# Patient Record
Sex: Male | Born: 1980
Health system: Southern US, Community
[De-identification: ages and names within clinical notes are randomized; demographics above are authoritative.]

---

## 2004-08-16 HISTORY — PX: APPENDECTOMY: SHX54

## 2004-11-24 ENCOUNTER — Inpatient Hospital Stay: Payer: Self-pay | Admitting: Surgery

## 2011-12-22 ENCOUNTER — Ambulatory Visit: Payer: Self-pay | Admitting: Family Medicine

## 2012-06-11 ENCOUNTER — Emergency Department: Payer: Self-pay | Admitting: *Deleted

## 2012-09-08 IMAGING — US US EXTREM LOW VENOUS*R*
1 series · 17 of 24 positions shown · non-contrast
Comparison: none

REASON FOR EXAM: STAT CR [id number] Right leg calf ankle and foot
swelling Eval for DVT
COMMENTS:

[Series 1: us extrem low venous*right* · 17 of 25 slices shown]
[im 1/25]
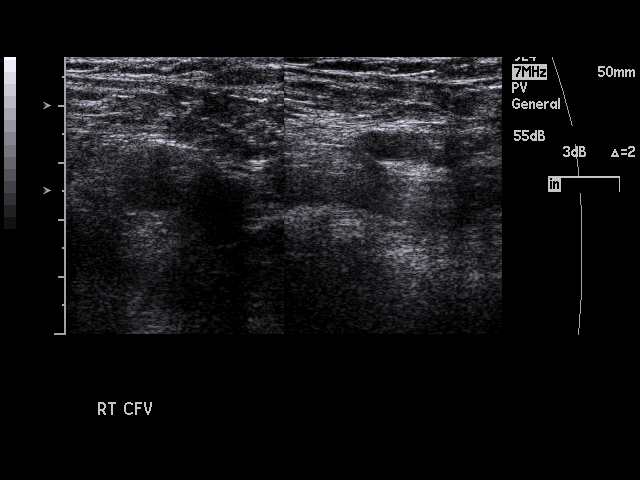
[im 3/25]
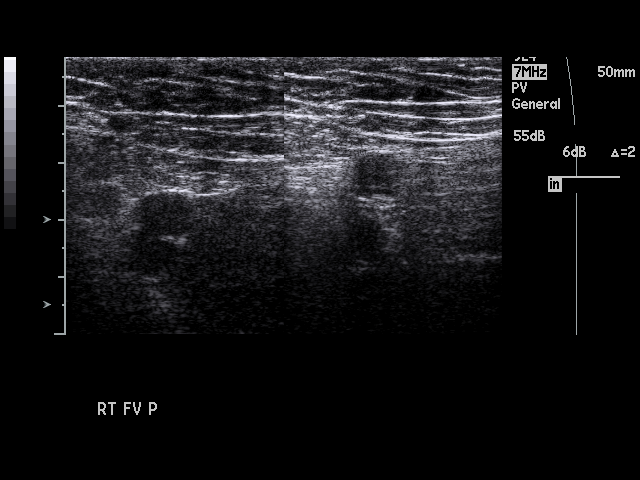
[im 4/25]
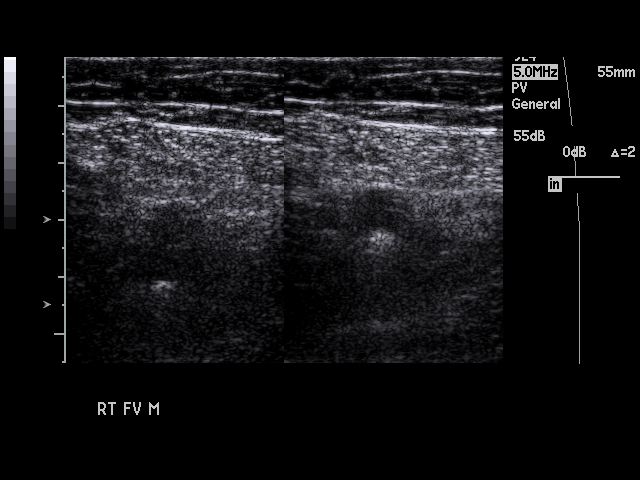
[im 5/25]
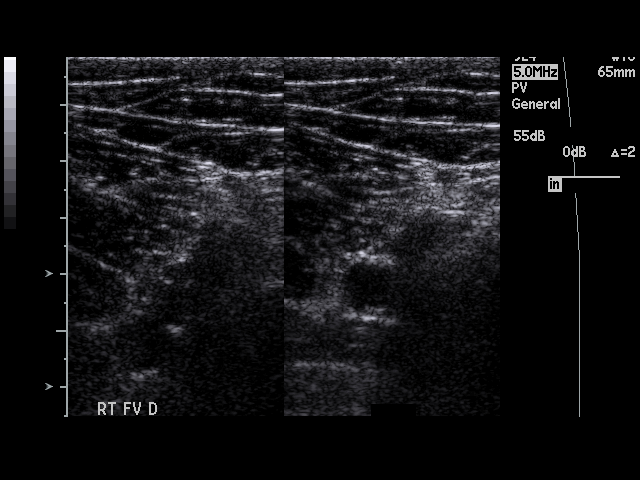
[im 7/25]
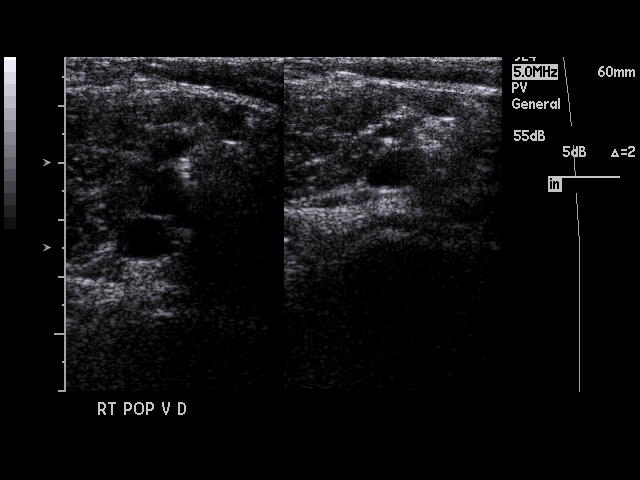
[im 8/25]
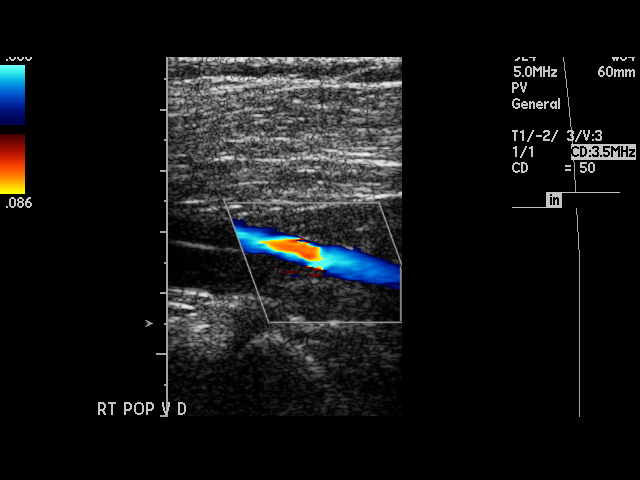
[im 10/25]
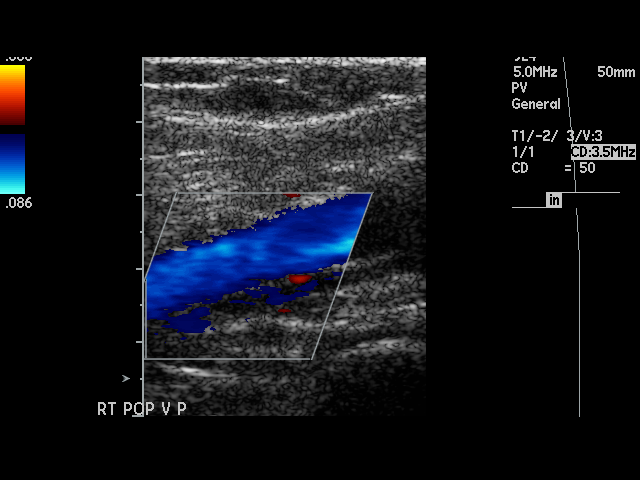
[im 11/25]
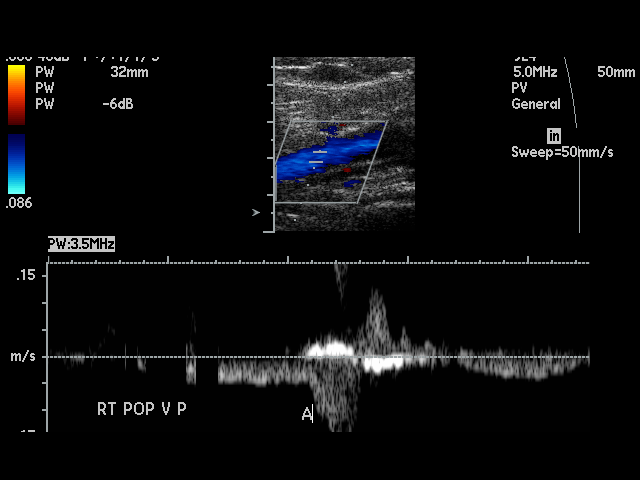
[im 13/25]
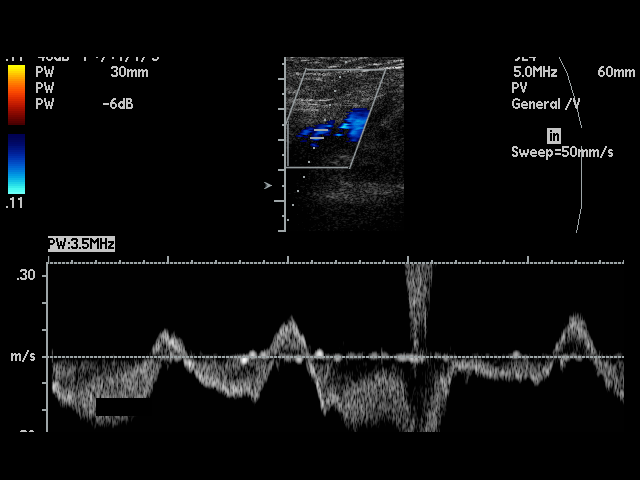
[im 14/25]
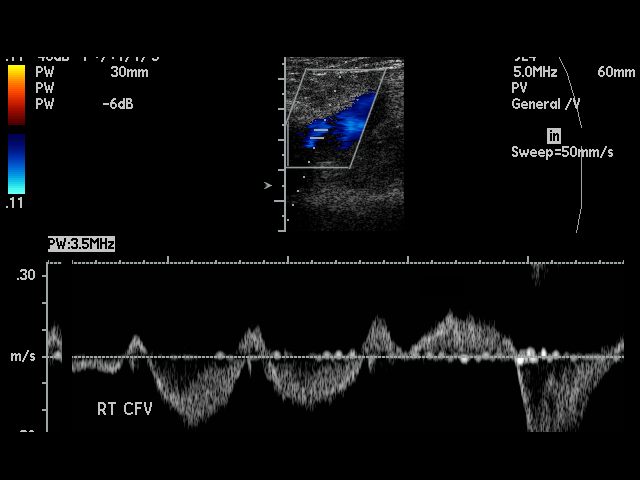
[im 15/25]
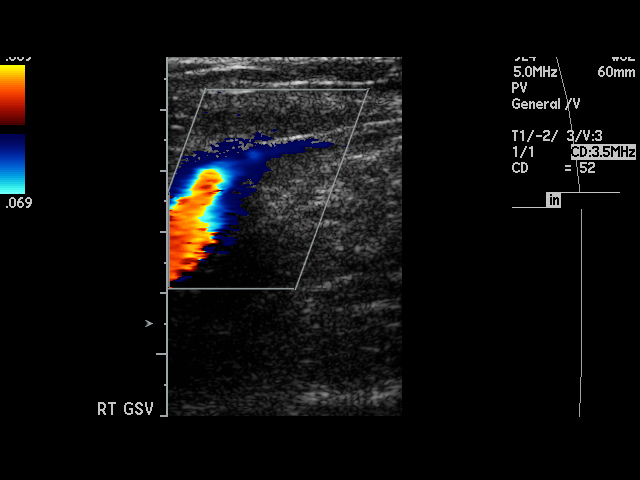
[im 17/25]
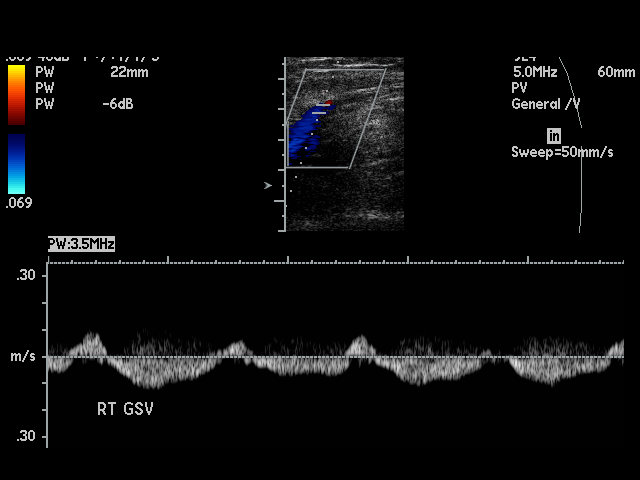
[im 18/25]
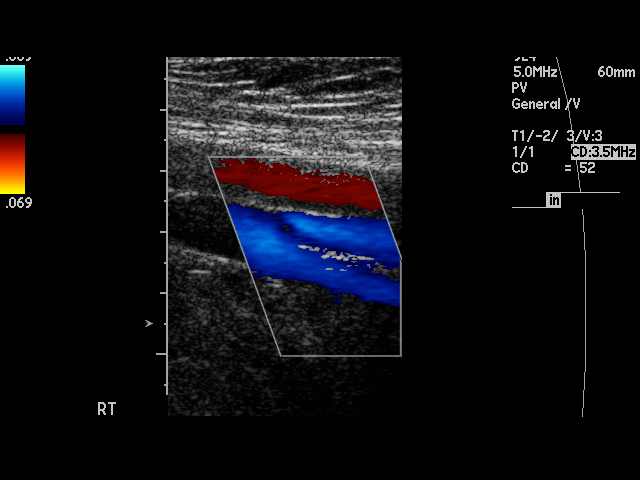
[im 20/25]
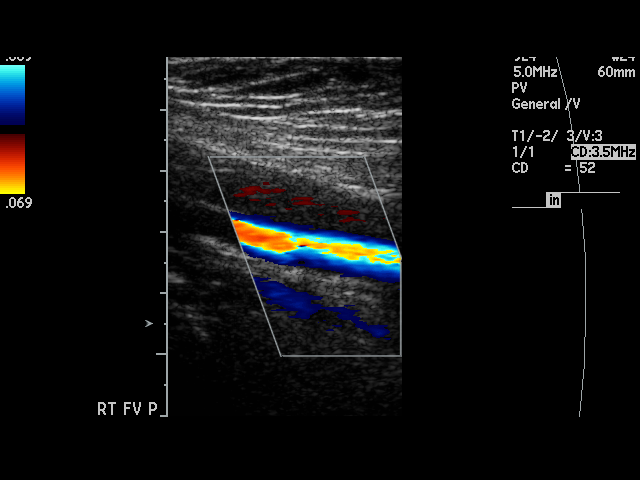
[im 21/25]
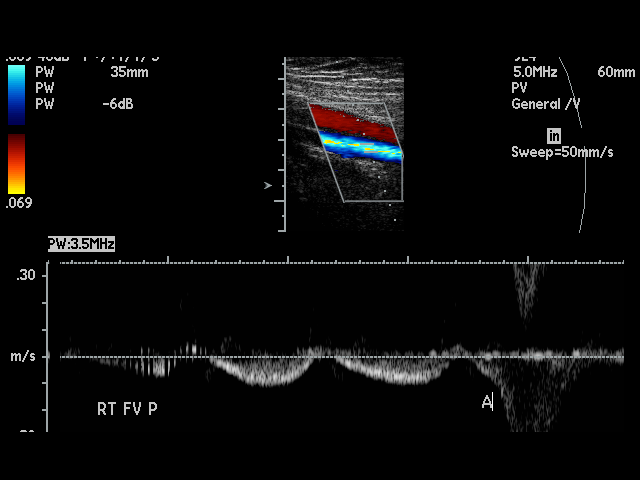
[im 22/25]
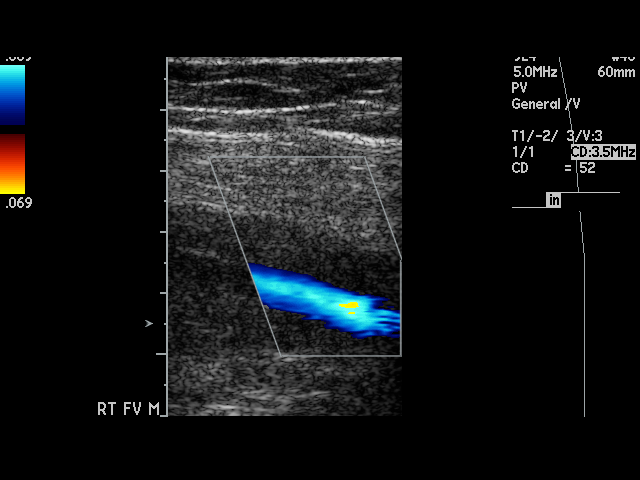
[im 25/25]
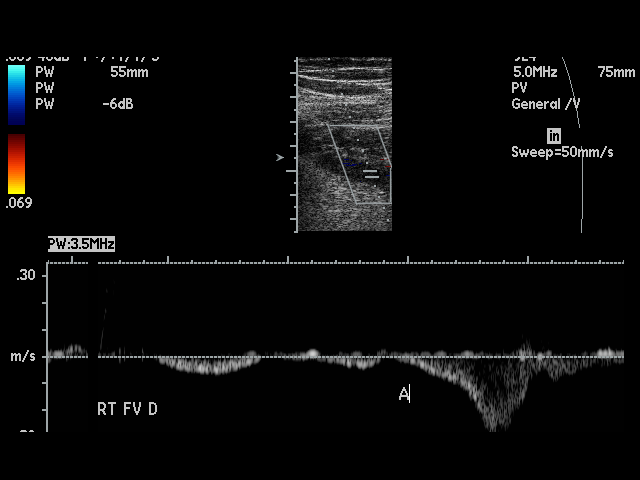

[17 of 24 positions shown; findings below may reference images not displayed]

PROCEDURE:     NAMA NAMAF - NAMA NAMAF DOPPLER VEINS RT LEG EXTR  - December 22, 2011  [DATE]

RESULT:     Doppler interrogation of the deep venous system of the right leg
is performed from the common femoral vein through the popliteal vein. The
deep venous structures demonstrate normal compressibility. The color and
spectral Doppler appearance is normal.
IMPRESSION: 1. No evidence of right lower extremity deep vein thrombosis.

[REDACTED]

## 2013-02-27 IMAGING — CR RIGHT ANKLE - COMPLETE 3+ VIEW
1 series · 5 of 5 positions shown · non-contrast
Comparison: none

REASON FOR EXAM: trauma
COMMENTS:

PROCEDURE:     DXR - DXR ANKLE RIGHT COMPLETE  - June 11, 2012  [DATE]
RESULT:     There is no evidence of fracture, dislocation, or malalignment.
There is evidence of a joint effusion particularly in the medial aspect of
the joint. This may represent component of ligamentous injury.

[Series 1: ap · 0.17mm/px · 5 of 5 slices shown]
[im 1/5]
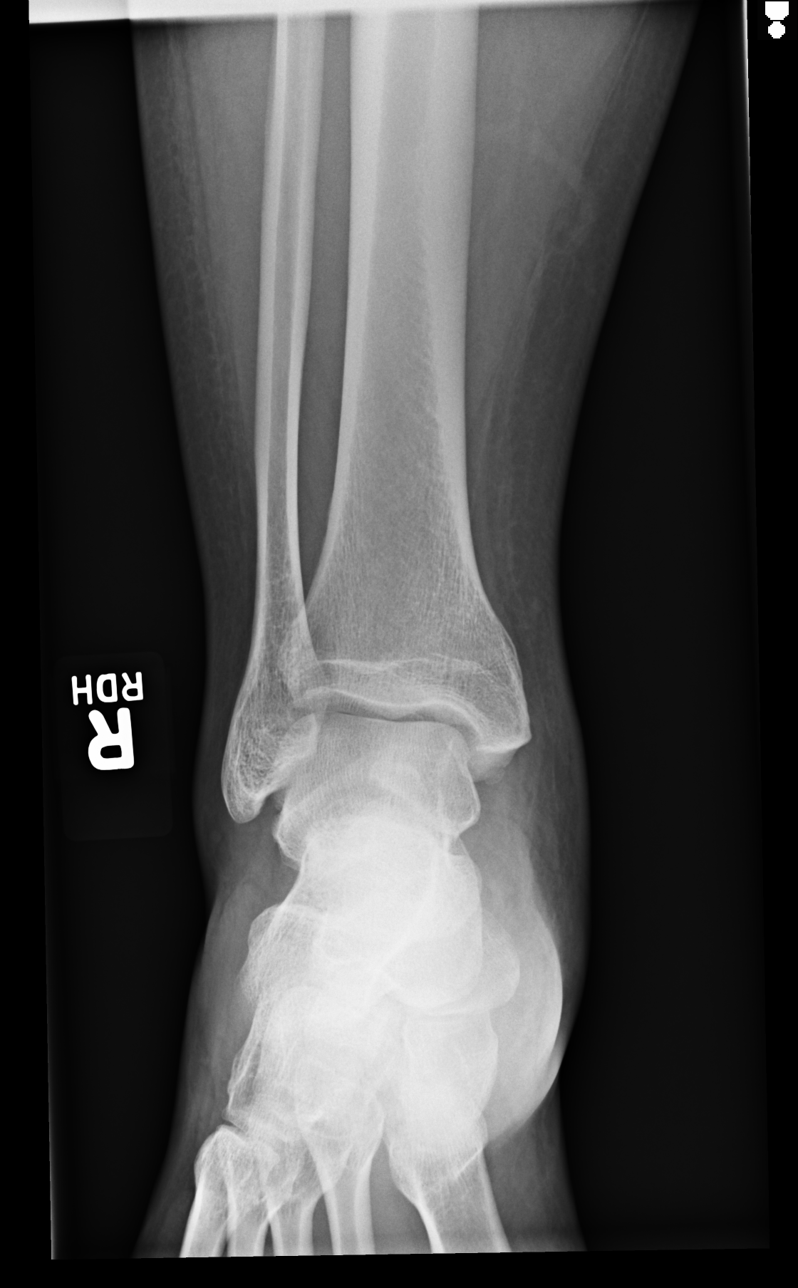
[im 2/5]
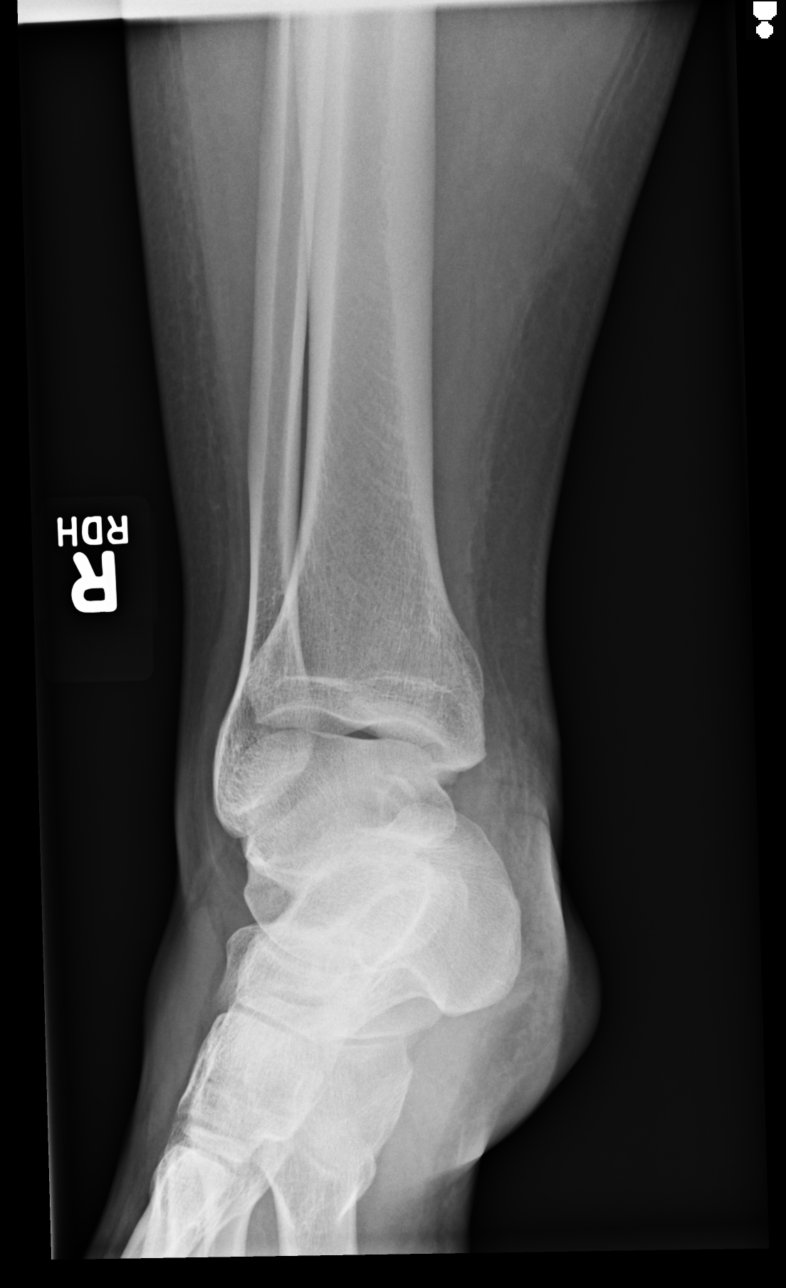
[im 3/5]
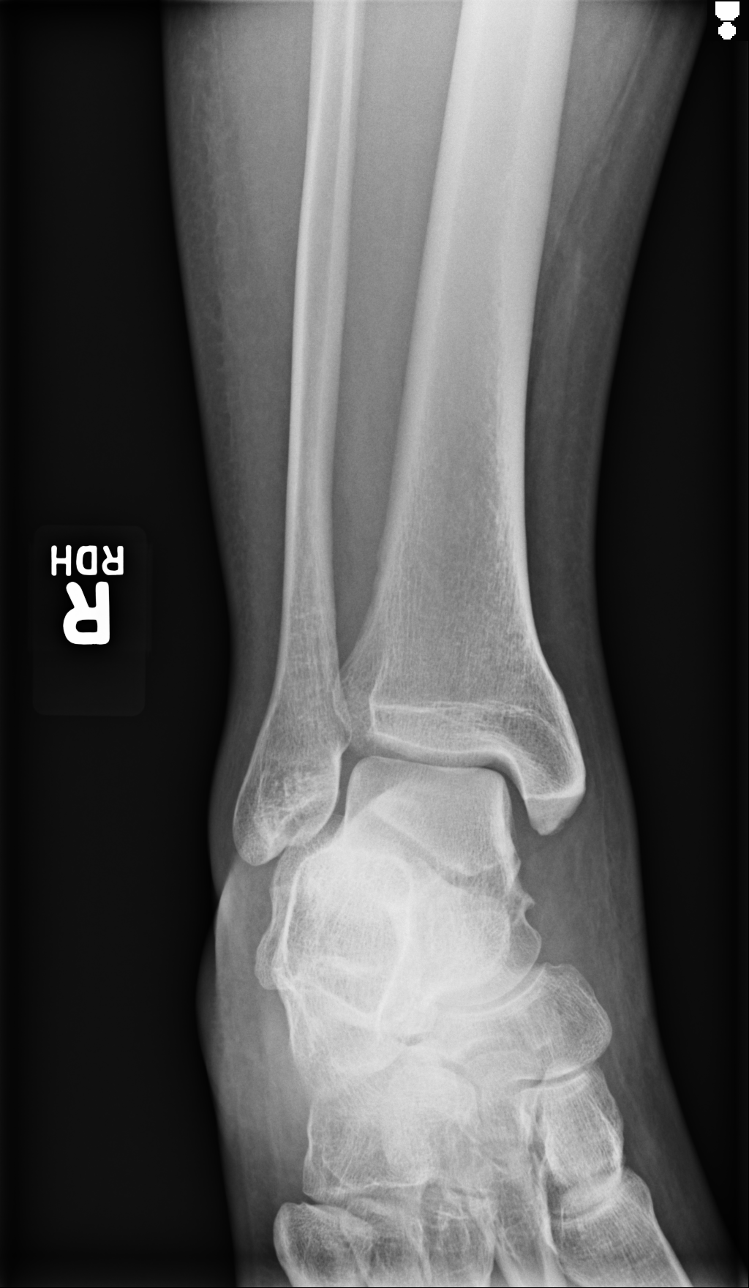
[im 4/5]
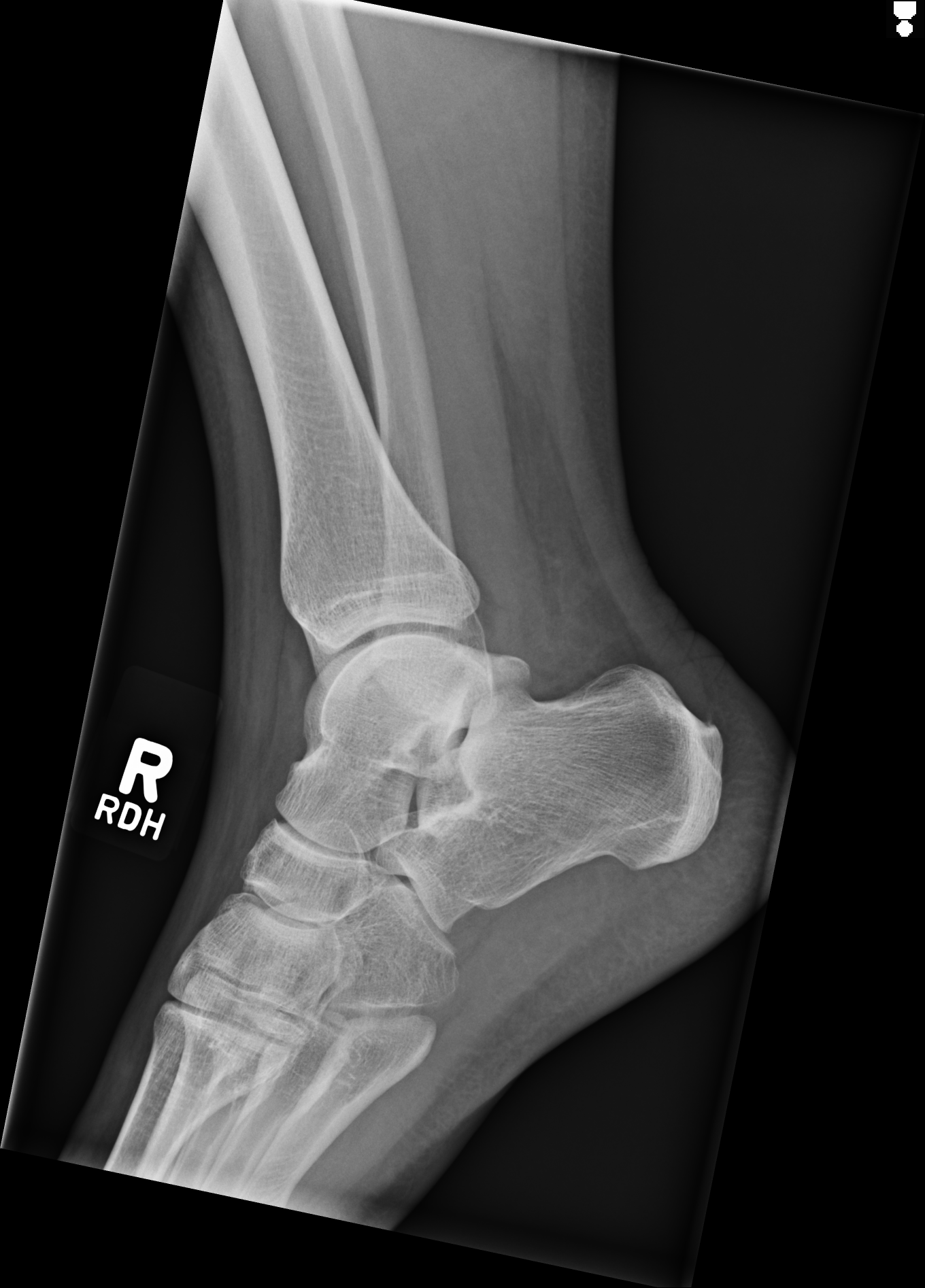
[im 5/5]
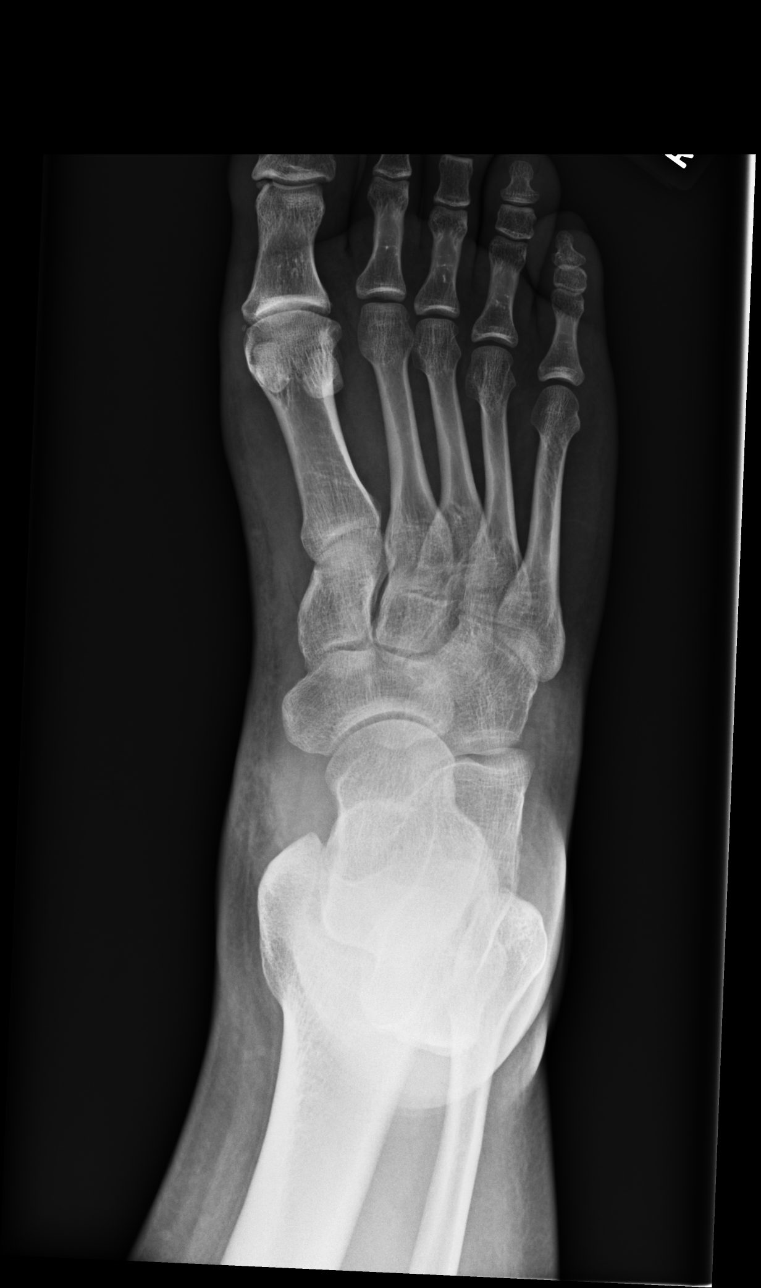

[5 of 5 positions shown; findings below may reference images not displayed]

IMPRESSION: 1. No evidence of acute abnormalities.
2. If there are persistent complaints of pain or persistent clinical
concern, a repeat evaluation in 7-10 days is recommended if clinically
warranted.

## 2015-03-11 ENCOUNTER — Ambulatory Visit
Admission: RE | Admit: 2015-03-11 | Discharge: 2015-03-11 | Disposition: A | Discharge status: Home / Self Care | Payer: Commercial Managed Care - PPO | Source: Ambulatory Visit | Attending: Family Medicine | Admitting: Family Medicine

## 2015-03-11 ENCOUNTER — Encounter: Payer: Self-pay | Admitting: Family Medicine

## 2015-03-11 ENCOUNTER — Ambulatory Visit (INDEPENDENT_AMBULATORY_CARE_PROVIDER_SITE_OTHER): Payer: Commercial Managed Care - PPO | Admitting: Family Medicine

## 2015-03-11 VITALS — BP 110/72 | HR 62 | Temp 98.5°F | Resp 16 | Wt 190.0 lb

## 2015-03-11 DIAGNOSIS — M25561 Pain in right knee: Secondary | ICD-10-CM

## 2015-03-11 DIAGNOSIS — Z720 Tobacco use: Secondary | ICD-10-CM | POA: Insufficient documentation

## 2015-03-11 DIAGNOSIS — Z889 Allergy status to unspecified drugs, medicaments and biological substances status: Secondary | ICD-10-CM | POA: Insufficient documentation

## 2015-03-11 DIAGNOSIS — G4700 Insomnia, unspecified: Secondary | ICD-10-CM | POA: Insufficient documentation

## 2015-03-11 DIAGNOSIS — F32A Depression, unspecified: Secondary | ICD-10-CM

## 2015-03-11 DIAGNOSIS — F419 Anxiety disorder, unspecified: Secondary | ICD-10-CM | POA: Insufficient documentation

## 2015-03-11 DIAGNOSIS — F1910 Other psychoactive substance abuse, uncomplicated: Secondary | ICD-10-CM | POA: Insufficient documentation

## 2015-03-11 DIAGNOSIS — F329 Major depressive disorder, single episode, unspecified: Secondary | ICD-10-CM | POA: Insufficient documentation

## 2015-03-11 NOTE — Progress Notes (Signed)
   Subjective:    Patient ID: Dan Jones, male    DOB: 03-Dec-1980, 34 y.o.   MRN: [id number] Chief Complaint  Patient presents with  . Knee Pain    right knee     HPI  This 34 year old male noticed right knee crunching and grinding with some stiffness over the past year. Now feeling stiffness in the mornings and worse after sitting or squatting. No swelling. Describes discomfort as a pressure. No locking but some clicking occasionally. No recent injury. Uses a Neoprene brace for stiffness and discomfort with some relief. No known family history of arthritis.  History reviewed. No pertinent past medical history. Patient Active Problem List   Diagnosis Date Noted  . History of allergy 03/11/2015  . Tobacco user 03/11/2015  . Drug abuse 03/11/2015  . Insomnia 03/11/2015  . Depression 03/11/2015  . Anxiety disorder 03/11/2015   Past Surgical History  Procedure Laterality Date  . Appendectomy  2006   History  Substance Use Topics  . Smoking status: Current Every Day Smoker  . Smokeless tobacco: Not on file  . Alcohol Use: 0.0 oz/week    0 Standard drinks or equivalent per week     Comment: OCCASIONALLY   Family History  Problem Relation Age of Onset  . Depression Mother   . Depression Father   . Alcohol abuse Brother   . Hypertension Brother   . Alcohol abuse Maternal Uncle   . Drug abuse Maternal Uncle   . Alcohol abuse Paternal Uncle    No current outpatient prescriptions on file prior to visit.   No current facility-administered medications on file prior to visit.   No Known Allergies  Review of Systems  Constitutional: Negative.   Respiratory: Negative.   Cardiovascular: Negative.   Musculoskeletal: Positive for arthralgias.      BP 110/72 mmHg  Pulse 62  Temp(Src) 98.5 F (36.9 C) (Oral)  Resp 16  Wt 190 lb (86.183 kg)  Objective:   Physical Exam  Constitutional: He is oriented to person, place, and time. He appears well-developed and  well-nourished. No distress.  HENT:  Head: Normocephalic and atraumatic.  Right Ear: Hearing normal.  Left Ear: Hearing normal.  Nose: Nose normal.  Eyes: Conjunctivae and lids are normal. Right eye exhibits no discharge. Left eye exhibits no discharge. No scleral icterus.  Pulmonary/Chest: Effort normal. No respiratory distress.  Musculoskeletal: Normal range of motion.  No swelling, click or locking. Negative Drawer sign. Good pulses and strength throughout.  Neurological: He is alert and oriented to person, place, and time.  Skin: Skin is intact. No lesion and no rash noted.  Psychiatric: He has a normal mood and affect. His speech is normal and behavior is normal. Thought content normal.      Assessment & Plan:  1. Right anterior knee pain Gradual stiffness and ache in right knee. No locking or giving away. Notices some grinding and popping occasionally. May continue Neoprene Knee Sleeve Brace and use Aleve prn. Ice pack or moist heat may be used if needed for extra comfort. Will get x-ray evaluation to rule out degenerative joint disease or spurs. Recheck pending reports. - DG Knee Complete 4 Views Right

## 2015-03-12 ENCOUNTER — Telehealth: Payer: Self-pay

## 2015-03-12 NOTE — Telephone Encounter (Signed)
Patient advised as directed below. Patient verbalized understanding and agrees with treatment plan. 

## 2015-03-12 NOTE — Telephone Encounter (Signed)
-----   Message from Tamsen Roers, [place] sent at 03/11/2015  5:06 PM EDT ----- No sign of significant bone injury or arthritis. Probably some inflammation from stress on the joint. May continue Aleve or Advil and wearing brace. Recheck knee in 7-10 days if no better. May need orthopedic referral or physical therapy.

## 2015-03-12 NOTE — Telephone Encounter (Signed)
Attempted to contact patient. No answer nor voicemail.  

## 2015-11-27 IMAGING — CR DG KNEE COMPLETE 4+V*R*
1 series · 4 of 4 positions shown · non-contrast
Comparison: None.

CLINICAL DATA: Clicking and popping of the right knee for several
months, no trauma

EXAM:
RIGHT KNEE - COMPLETE 4+ VIEW

[Series 1: dg knee complete 4 views right · 0.14mm/px · 4 of 4 slices shown]
[im 1/4]
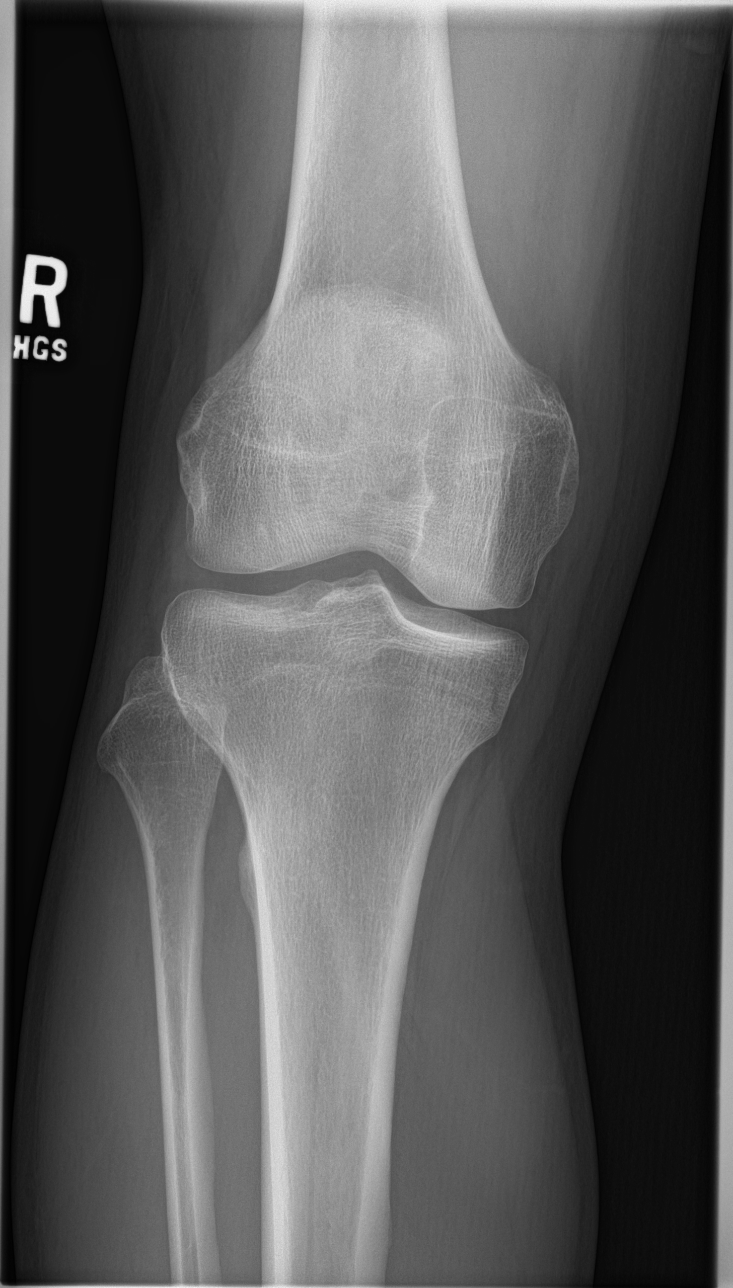
[im 2/4]
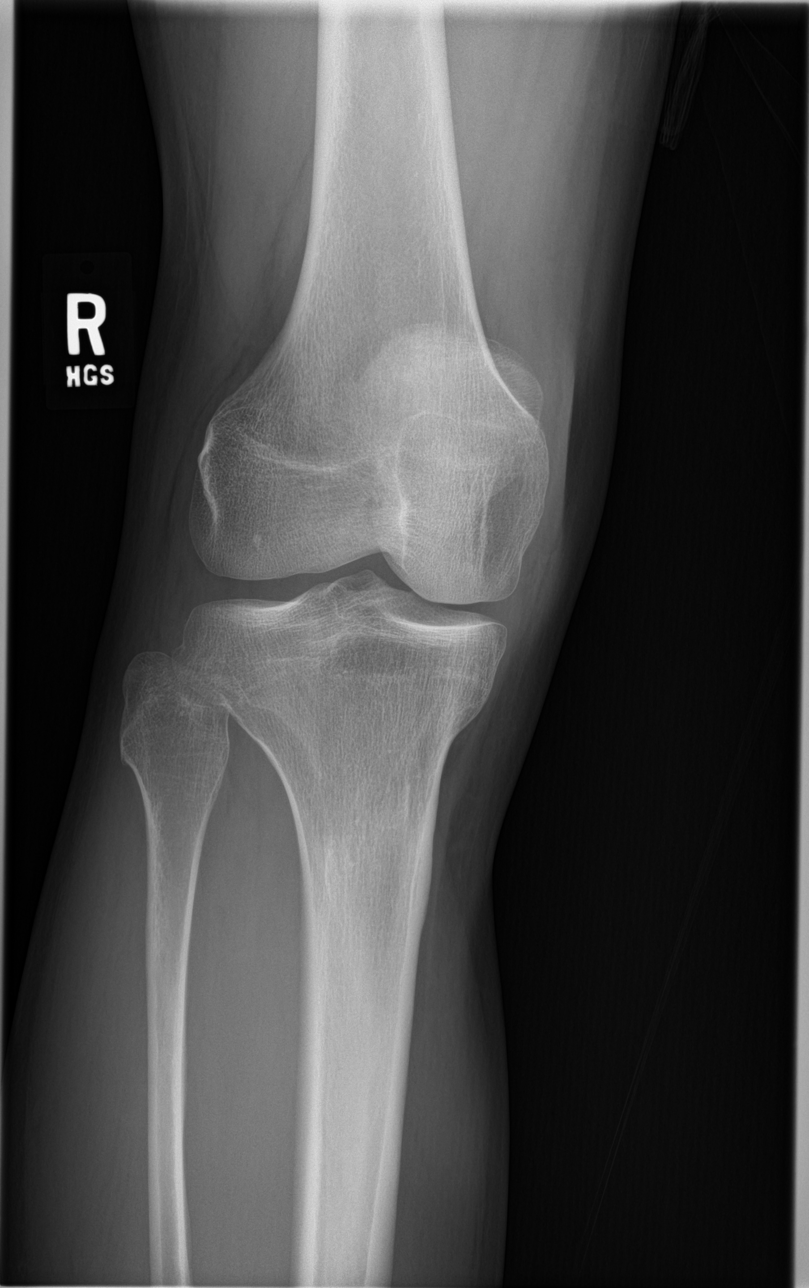
[im 3/4]
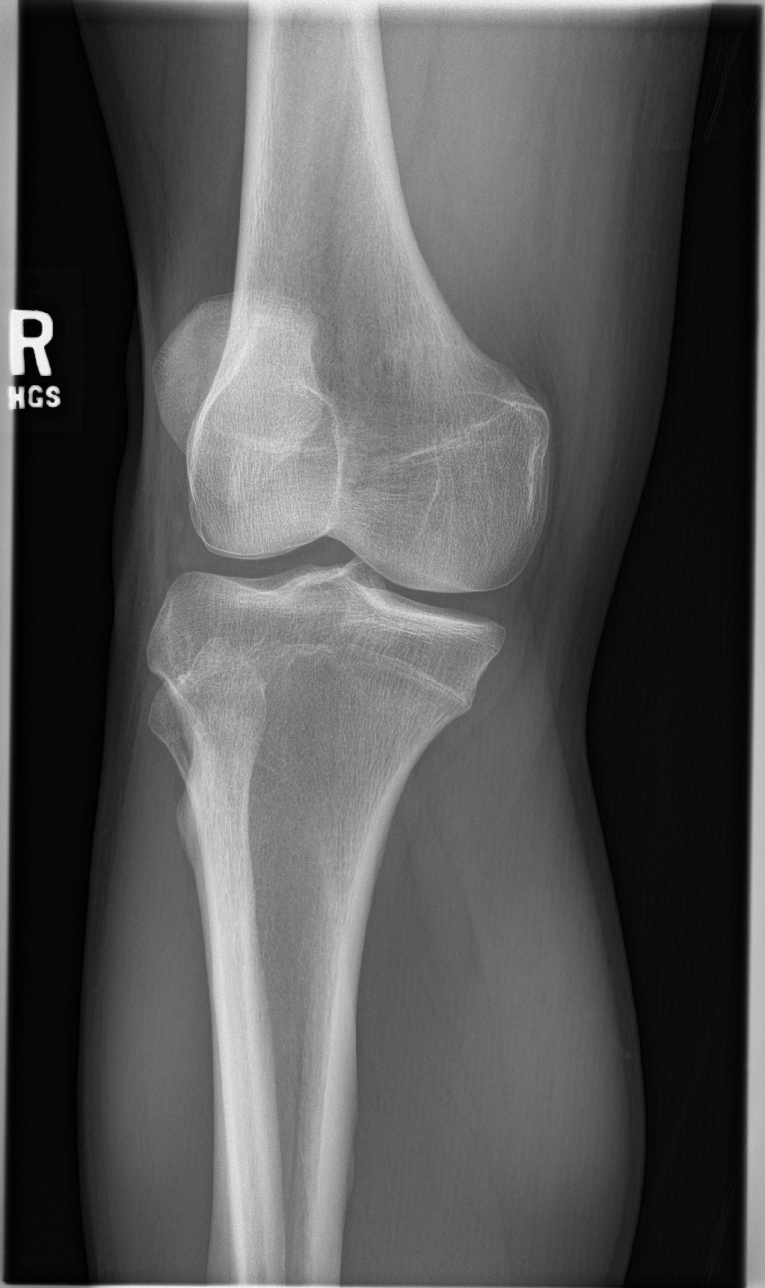
[im 4/4]
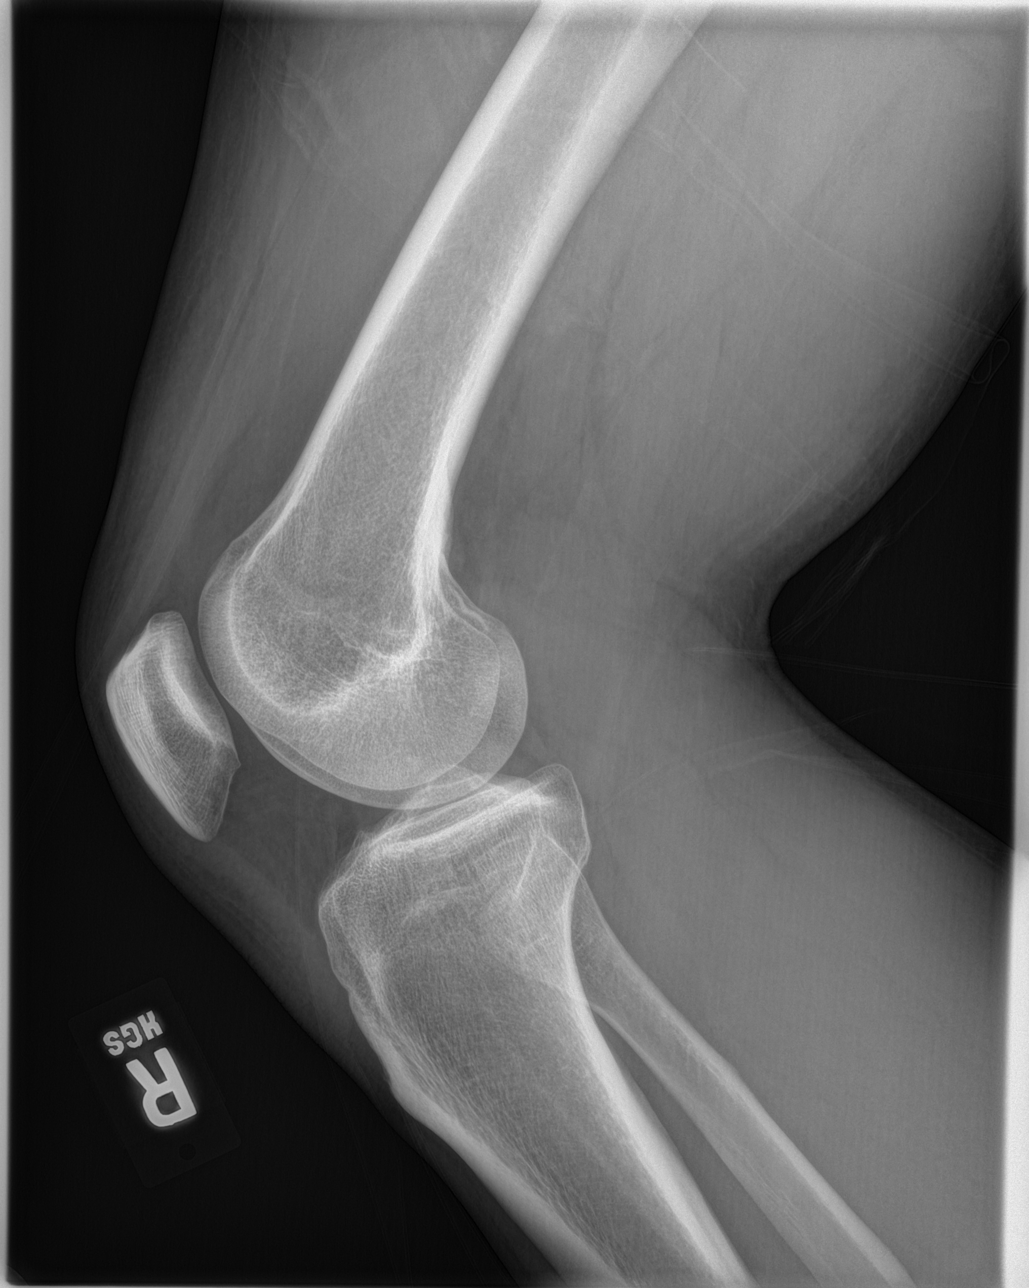

[4 of 4 positions shown; findings below may reference images not displayed]

FINDINGS: The right knee joint spaces appear normal. No fracture is seen. No
joint effusion is noted.
IMPRESSION: Negative.

## 2016-03-29 ENCOUNTER — Ambulatory Visit (INDEPENDENT_AMBULATORY_CARE_PROVIDER_SITE_OTHER): Payer: Commercial Managed Care - PPO | Admitting: Family Medicine

## 2016-03-29 ENCOUNTER — Encounter: Payer: Self-pay | Admitting: Family Medicine

## 2016-03-29 VITALS — BP 122/70 | HR 74 | Temp 98.0°F | Resp 16 | Wt 188.0 lb

## 2016-03-29 DIAGNOSIS — F418 Other specified anxiety disorders: Secondary | ICD-10-CM

## 2016-03-29 DIAGNOSIS — F5105 Insomnia due to other mental disorder: Secondary | ICD-10-CM

## 2016-03-29 DIAGNOSIS — F341 Dysthymic disorder: Secondary | ICD-10-CM

## 2016-03-29 NOTE — Progress Notes (Signed)
Patient: Dan Jones Male    DOB: [birth year]   34 y.o.   MRN: [id number] Visit Date: 03/29/2016  Today's Provider: Dortha Kernennis Chrismon, PA   Chief Complaint  Patient presents with  . Anxiety   Subjective:    HPI Pt is here for anxiety. He reports that he has had anxiety for about a year but in the past 3-4 months it has gotten to were he can not manage it anymore. He feels like he is always on edge and anything little thing sends him over the edge. He has a demanding job at work and has to deal with a lot there. He sometimes can not keep his cool. He reports that he has been having panic attacks with racing heart, chest pain, shortness of breath and once he gets calmed down all the symptoms go away. He is also having left shoulder pain. He says it feels like the same pain he had when he tore his rotator cuff on the right arm but not as bad.    History reviewed. No pertinent past medical history. Patient Active Problem List   Diagnosis Date Noted  . History of allergy 03/11/2015  . Tobacco user 03/11/2015  . Drug abuse 03/11/2015  . Insomnia 03/11/2015  . Depression 03/11/2015  . Anxiety disorder 03/11/2015   Past Surgical History:  Procedure Laterality Date  . APPENDECTOMY  2006   Family History  Problem Relation Age of Onset  . Depression Mother   . Depression Father   . Alcohol abuse Brother   . Hypertension Brother   . Alcohol abuse Maternal Uncle   . Drug abuse Maternal Uncle   . Alcohol abuse Paternal Uncle     No Known Allergies No outpatient prescriptions have been marked as taking for the 03/29/16 encounter (Office Visit) with Jodell Ciproennis E Chrismon, PA.    Review of Systems  Constitutional: Positive for fatigue.  HENT: Negative.   Eyes: Negative.   Respiratory: Positive for shortness of breath.   Cardiovascular: Positive for chest pain.  Gastrointestinal: Negative.   Endocrine: Negative.   Genitourinary: Negative.   Musculoskeletal: Positive for  arthralgias.  Skin: Negative.   Allergic/Immunologic: Negative.   Neurological: Negative.   Hematological: Negative.   Psychiatric/Behavioral: Positive for agitation. The patient is nervous/anxious.    Social History  Substance Use Topics  . Smoking status: Current Every Day Smoker  . Smokeless tobacco: Never Used  . Alcohol use 0.0 oz/week     Comment: OCCASIONALLY   Objective:   BP 122/70 (BP Location: Right Arm, Patient Position: Sitting, Cuff Size: Large)   Pulse 74   Temp 98 F (36.7 C) (Oral)   Resp 16   Wt 188 lb (85.3 kg)   BMI 26.98 kg/m   Physical Exam  Constitutional: He is oriented to person, place, and time. He appears well-developed and well-nourished. No distress.  HENT:  Head: Normocephalic and atraumatic.  Right Ear: Hearing and external ear normal.  Left Ear: Hearing normal.  Nose: Nose normal.  Mouth/Throat: Oropharynx is clear and moist.  Eyes: Conjunctivae, EOM and lids are normal. Right eye exhibits no discharge. Left eye exhibits no discharge. No scleral icterus.  Neck: Neck supple. No thyromegaly present.  Cardiovascular: Normal rate, regular rhythm and normal heart sounds.   Pulmonary/Chest: Effort normal and breath sounds normal. No respiratory distress.  Abdominal: Soft. Bowel sounds are normal.  Musculoskeletal: Normal range of motion.  Lymphadenopathy:    He has no cervical  adenopathy.  Neurological: He is alert and oriented to person, place, and time.  Skin: Skin is intact. No lesion and no rash noted.  Psychiatric: His speech is normal and behavior is normal. Thought content normal. His mood appears anxious.      Assessment & Plan:     1. Insomnia secondary to depression with anxiety Having irritability, decreased focus, frequently wake from sleep at night and anxiety with sadness. Smokes marijuana occasionally and feels he can focus better but does not help other symptoms. 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 Employer has begged him not to leave but this stress increases his irritability and anxiety. Does not eat more than one or two meals a day. Feels better when he works in his shop at home. Has a history of anxiety with depression in 2015 that was treated by Dr. Maryruth BunKapur (psychiatrist). Recommend he restrict caffeine consumption, stop marijuana and work on stopping tobacco use (smokes 1-1.5 ppd). Will get routine labs to rule out metabolic disorder. May need  Seroquel or Geodon for suspected Bipolar Disorder. Recheck pending lab reports.  - CBC with Differential/Platelet - Comprehensive metabolic panel - TSH - T4     Dortha Kernennis Chrismon, PA  [facility]

## 2016-04-01 ENCOUNTER — Other Ambulatory Visit: Payer: Self-pay | Admitting: Family Medicine

## 2016-04-01 LAB — CBC WITH DIFFERENTIAL/PLATELET
BASOS ABS: 0 10*3/uL (ref 0.0–0.2)
Basos: 0 %
EOS (ABSOLUTE): 0.3 10*3/uL (ref 0.0–0.4)
EOS: 3 %
HEMATOCRIT: 43 % (ref 37.5–51.0)
HEMOGLOBIN: 14.3 g/dL (ref 12.6–17.7)
Immature Grans (Abs): 0 10*3/uL (ref 0.0–0.1)
Immature Granulocytes: 0 %
Lymphocytes Absolute: 2.2 10*3/uL (ref 0.7–3.1)
Lymphs: 27 %
MCH: 31.4 pg (ref 26.6–33.0)
MCHC: 33.3 g/dL (ref 31.5–35.7)
MCV: 94 fL (ref 79–97)
Monocytes Absolute: 0.7 10*3/uL (ref 0.1–0.9)
Monocytes: 9 %
NEUTROS ABS: 5 10*3/uL (ref 1.4–7.0)
Neutrophils: 61 %
Platelets: 206 10*3/uL (ref 150–379)
RBC: 4.56 x10E6/uL (ref 4.14–5.80)
RDW: 13.4 % (ref 12.3–15.4)
WBC: 8.1 10*3/uL (ref 3.4–10.8)

## 2016-04-01 LAB — COMPREHENSIVE METABOLIC PANEL
A/G RATIO: 2 (ref 1.2–2.2)
ALBUMIN: 4.7 g/dL (ref 3.5–5.5)
ALK PHOS: 50 IU/L (ref 39–117)
ALT: 14 IU/L (ref 0–44)
AST: 19 IU/L (ref 0–40)
BUN / CREAT RATIO: 14 (ref 9–20)
BUN: 13 mg/dL (ref 6–20)
Bilirubin Total: 0.5 mg/dL (ref 0.0–1.2)
CHLORIDE: 102 mmol/L (ref 96–106)
CO2: 25 mmol/L (ref 18–29)
Calcium: 9.3 mg/dL (ref 8.7–10.2)
Creatinine, Ser: 0.9 mg/dL (ref 0.76–1.27)
GFR calc Af Amer: 128 mL/min/{1.73_m2} (ref >59)
GFR calc non Af Amer: 111 mL/min/{1.73_m2} (ref >59)
Globulin, Total: 2.4 g/dL (ref 1.5–4.5)
Glucose: 98 mg/dL (ref 65–99)
POTASSIUM: 4.3 mmol/L (ref 3.5–5.2)
SODIUM: 142 mmol/L (ref 134–144)
Total Protein: 7.1 g/dL (ref 6.0–8.5)

## 2016-04-01 LAB — T4: T4, Total: 6.1 ug/dL (ref 4.5–12.0)

## 2016-04-01 LAB — TSH: TSH: 5.18 u[IU]/mL — AB (ref 0.450–4.500)

## 2016-04-02 MED ORDER — QUETIAPINE FUMARATE 25 MG PO TABS: 25.0000 mg | ORAL_TABLET | Freq: Every day | ORAL | 0 refills | Status: DC

## 2016-04-02 NOTE — Addendum Note (Signed)
Addended by: Tomasita CrumbleROZDOWSKI, EMILY M on: 04/02/2016 10:57 AM   Modules accepted: Orders

## 2016-04-16 ENCOUNTER — Encounter: Payer: Self-pay | Admitting: Family Medicine

## 2016-04-16 ENCOUNTER — Ambulatory Visit (INDEPENDENT_AMBULATORY_CARE_PROVIDER_SITE_OTHER): Payer: Commercial Managed Care - PPO | Admitting: Family Medicine

## 2016-04-16 VITALS — BP 104/78 | HR 62 | Temp 98.1°F | Resp 16 | Wt 184.2 lb

## 2016-04-16 DIAGNOSIS — F39 Unspecified mood [affective] disorder: Secondary | ICD-10-CM

## 2016-04-16 DIAGNOSIS — G4700 Insomnia, unspecified: Secondary | ICD-10-CM

## 2016-04-16 MED ORDER — QUETIAPINE FUMARATE 25 MG PO TABS: 25.0000 mg | ORAL_TABLET | Freq: Every day | ORAL | 3 refills | Status: DC

## 2016-04-16 NOTE — Progress Notes (Signed)
Patient: Dan Jones Male    DOB: January 26, 1981   34 y.o.   MRN: [id number] Visit Date: 04/16/2016  Today's Provider: Dortha Kernennis Chrismon, PA   Chief Complaint  Patient presents with  . Anxiety  . Manic Behavior  . Insomnia   Subjective:    HPI Patient is here for a 2 week follow up. Patient was prescribed Seroquel 25 mg tablet on 03/29/2016 for insomnia secondary to depression with anxiety. Patient reports good compliance. Patient reports the medication is helping him sleep better. He states he is not as "loopy" and more level now that the medication has got in his system.  Patient states he feels a lot better since starting the medication.   Patient Active Problem List   Diagnosis Date Noted  . History of allergy 03/11/2015  . Tobacco user 03/11/2015  . Drug abuse 03/11/2015  . Insomnia 03/11/2015  . Depression 03/11/2015  . Anxiety disorder 03/11/2015   Past Surgical History:  Procedure Laterality Date  . APPENDECTOMY  2006   Family History  Problem Relation Age of Onset  . Depression Mother   . Depression Father   . Alcohol abuse Brother   . Hypertension Brother   . Alcohol abuse Maternal Uncle   . Drug abuse Maternal Uncle   . Alcohol abuse Paternal Uncle    No Known Allergies  Previous Medications   QUETIAPINE (SEROQUEL) 25 MG TABLET    Take 1 tablet (25 mg total) by mouth at bedtime.   Review of Systems  Constitutional: Negative.   Respiratory: Negative.   Cardiovascular: Negative.   Psychiatric/Behavioral: Positive for dysphoric mood and sleep disturbance. The patient is nervous/anxious.    Social History  Substance Use Topics  . Smoking status: Current Every Day Smoker    Packs/day: 1.00  . Smokeless tobacco: Never Used  . Alcohol use 0.0 oz/week     Comment: OCCASIONALLY   Objective:   BP 104/78 (BP Location: Right Arm, Patient Position: Sitting, Cuff Size: Normal)   Pulse 62   Temp 98.1 F (36.7 C) (Oral)   Resp 16   Wt 184 lb 3.2 oz (83.6  kg)   BMI 26.43 kg/m   Physical Exam  Constitutional: He is oriented to person, place, and time. He appears well-developed and well-nourished. No distress.  HENT:  Head: Normocephalic and atraumatic.  Right Ear: Hearing normal.  Left Ear: Hearing normal.  Nose: Nose normal.  Eyes: Conjunctivae and lids are normal. Right eye exhibits no discharge. Left eye exhibits no discharge. No scleral icterus.  Neck: Neck supple.  Cardiovascular: Normal rate and regular rhythm.   Pulmonary/Chest: Effort normal. No respiratory distress.  Abdominal: Bowel sounds are normal.  Musculoskeletal: Normal range of motion.  Neurological: He is alert and oriented to person, place, and time.  Skin: Skin is intact. No lesion and no rash noted.  Psychiatric: He has a normal mood and affect. His speech is normal and behavior is normal. Thought content normal.      Assessment & Plan:     1. Mood disorder (HCC) Much improved mood. No irritability and anxiety. 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 Good appetite. Will continue present dosage and recheck in 2 months. - QUEtiapine (SEROQUEL) 25 MG tablet; Take 1 tablet (25 mg total) by mouth at bedtime.  Dispense: 30 tablet; Refill: 3  2. Insomnia Only waking once a night to go to the bathroom now. Feeling rested and since being on  the Seroquel for 2 weeks, no longer having morning drowsiness. Will continue present dosage and recheck in 2 months. - QUEtiapine (SEROQUEL) 25 MG tablet; Take 1 tablet (25 mg total) by mouth at bedtime.  Dispense: 30 tablet; Refill: 3

## 2016-06-18 ENCOUNTER — Ambulatory Visit: Payer: Commercial Managed Care - PPO | Admitting: Family Medicine

## 2016-06-21 ENCOUNTER — Encounter: Payer: Self-pay | Admitting: Family Medicine

## 2016-06-21 ENCOUNTER — Ambulatory Visit (INDEPENDENT_AMBULATORY_CARE_PROVIDER_SITE_OTHER): Payer: Commercial Managed Care - PPO | Admitting: Family Medicine

## 2016-06-21 VITALS — BP 122/74 | HR 84 | Temp 98.1°F | Resp 18 | Wt 187.6 lb

## 2016-06-21 DIAGNOSIS — F39 Unspecified mood [affective] disorder: Secondary | ICD-10-CM | POA: Diagnosis not present

## 2016-06-21 NOTE — Progress Notes (Signed)
   Patient: Dan SpiceChristopher D Hellwig Male    DOB: 02-Jan-1981   35 y.o.   MRN: [id number] Visit Date: 06/21/2016  Today's Provider: Dortha Kernennis Alie [place]grove, PA   Chief Complaint  Patient presents with  . Follow-up    mood disorder    Subjective:    HPI  Mood Disorder: Patient is here today to follow up from office visit on 04/16/2016. Patient was advised to continue Seroquel 25 mg. Patient symptoms were much improved since starting Seroquel. Patient reports excellent compliance with treatment plan. Patient reports symptoms are stable, but he feels like he is unable to get up in the mornings as easy as before.  Patient states he has been taking medication later at night because he has been working in the garage at home. He states he take the medication 10-15 minutes before ready for bed.   Patient Active Problem List   Diagnosis Date Noted  . History of allergy 03/11/2015  . Tobacco user 03/11/2015  . Drug abuse 03/11/2015  . Insomnia 03/11/2015  . Depression 03/11/2015  . Anxiety disorder 03/11/2015   Past Surgical History:  Procedure Laterality Date  . APPENDECTOMY  2006   Family History  Problem Relation Age of Onset  . Depression Mother   . Depression Father   . Alcohol abuse Brother   . Hypertension Brother   . Alcohol abuse Maternal Uncle   . Drug abuse Maternal Uncle   . Alcohol abuse Paternal Uncle    No Known Allergies   Previous Medications   QUETIAPINE (SEROQUEL) 25 MG TABLET    Take 1 tablet (25 mg total) by mouth at bedtime.    Review of Systems  Constitutional: Negative.   Respiratory: Negative.   Cardiovascular: Negative.   Psychiatric/Behavioral: Positive for sleep disturbance.    Social History  Substance Use Topics  . Smoking status: Current Every Day Smoker    Packs/day: 1.00  . Smokeless tobacco: Never Used  . Alcohol use 0.0 oz/week     Comment: OCCASIONALLY   Objective:   BP 122/74 (BP Location: Right Arm, Patient Position: Sitting, Cuff Size:  Normal)   Pulse 84   Temp 98.1 F (36.7 C) (Oral)   Resp 18   Wt 187 lb 9.6 oz (85.1 kg)   BMI 26.92 kg/m   Physical Exam  Constitutional: He is oriented to person, place, and time. He appears well-developed and well-nourished. No distress.  HENT:  Head: Normocephalic and atraumatic.  Right Ear: Hearing normal.  Left Ear: Hearing normal.  Nose: Nose normal.  Eyes: Conjunctivae and lids are normal. Right eye exhibits no discharge. Left eye exhibits no discharge. No scleral icterus.  Pulmonary/Chest: Effort normal. No respiratory distress.  Musculoskeletal: Normal range of motion.  Neurological: He is alert and oriented to person, place, and time.  Skin: Skin is intact. No lesion and no rash noted.  Psychiatric: He has a normal mood and affect. His speech is normal and behavior is normal. Thought content normal.      Assessment & Plan:     1. Mood disorder (HCC) Improved coping skills and less irritability since starting the Seroquel 25 mg HS. Some change in work schedule and less opportunity to sleep a full 7-8 hours. This has caused some hangover sleepiness in the mornings. Recommend changing Seroquel to 25 mg daily except MWF. Recheck in 2 months.

## 2016-08-06 ENCOUNTER — Ambulatory Visit (INDEPENDENT_AMBULATORY_CARE_PROVIDER_SITE_OTHER): Payer: Commercial Managed Care - PPO | Admitting: Family Medicine

## 2016-08-06 ENCOUNTER — Encounter: Payer: Self-pay | Admitting: Family Medicine

## 2016-08-06 VITALS — BP 124/88 | HR 84 | Temp 98.7°F | Resp 16 | Wt 180.0 lb

## 2016-08-06 DIAGNOSIS — F321 Major depressive disorder, single episode, moderate: Secondary | ICD-10-CM | POA: Diagnosis not present

## 2016-08-06 DIAGNOSIS — F411 Generalized anxiety disorder: Secondary | ICD-10-CM | POA: Diagnosis not present

## 2016-08-06 MED ORDER — VENLAFAXINE HCL 37.5 MG PO TABS: 37.5000 mg | ORAL_TABLET | Freq: Two times a day (BID) | ORAL | 2 refills | Status: DC
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
## 2016-08-20 ENCOUNTER — Telehealth: Payer: Self-pay

## 2016-08-20 NOTE — Telephone Encounter (Signed)
Returned patient's call. Rescheduled a follow up appointment that was scheduled to soon.

## 2016-08-20 NOTE — Telephone Encounter (Signed)
Patient called and states he received a phone call from WilloughbyNikki, I did not see anything in the chart. Please call patient back at [phone]

## 2016-08-23 ENCOUNTER — Ambulatory Visit: Payer: Commercial Managed Care - PPO | Admitting: Psychology

## 2016-08-23 ENCOUNTER — Ambulatory Visit: Payer: Commercial Managed Care - PPO | Admitting: Family Medicine

## 2016-09-09 ENCOUNTER — Ambulatory Visit: Payer: Self-pay | Admitting: Family Medicine

## 2016-09-17 ENCOUNTER — Ambulatory Visit: Payer: Self-pay | Admitting: Family Medicine

## 2016-09-20 ENCOUNTER — Ambulatory Visit (INDEPENDENT_AMBULATORY_CARE_PROVIDER_SITE_OTHER): Payer: Commercial Managed Care - PPO | Admitting: Family Medicine

## 2016-09-20 ENCOUNTER — Encounter: Payer: Self-pay | Admitting: Family Medicine

## 2016-09-20 VITALS — BP 118/78 | HR 72 | Temp 98.3°F | Resp 16 | Wt 186.0 lb

## 2016-09-20 DIAGNOSIS — F321 Major depressive disorder, single episode, moderate: Secondary | ICD-10-CM | POA: Diagnosis not present

## 2016-09-20 DIAGNOSIS — F411 Generalized anxiety disorder: Secondary | ICD-10-CM

## 2016-09-20 MED ORDER — BUSPIRONE HCL 5 MG PO TABS: ORAL_TABLET | ORAL | 1 refills | Status: DC

## 2016-09-20 MED ORDER — VENLAFAXINE HCL 75 MG PO TABS: 75.0000 mg | ORAL_TABLET | Freq: Two times a day (BID) | ORAL | 3 refills | Status: DC

## 2016-09-20 NOTE — Progress Notes (Signed)
Patient: Dan Jones Male    DOB: Nov 29, 1980   35 y.o.   MRN: [id number] Visit Date: 09/20/2016  Today's Provider: Dortha Kernennis Lyvonne Cassell, PA   Chief Complaint  Patient presents with  . Anxiety  . Follow-up   Subjective:    Anxiety  Presents for follow-up visit. Symptoms include depressed mood, nervous/anxious behavior and panic. Symptoms occur most days. The quality of sleep is good.   Compliance with medications is 76-100% (Effexor 37.5 mg).   Patient Active Problem List   Diagnosis Date Noted  . History of allergy 03/11/2015  . Tobacco user 03/11/2015  . Drug abuse 03/11/2015  . Insomnia 03/11/2015  . Depression 03/11/2015  . Anxiety disorder 03/11/2015   Past Surgical History:  Procedure Laterality Date  . APPENDECTOMY  2006   Family History  Problem Relation Age of Onset  . Depression Mother   . Depression Father   . Alcohol abuse Brother   . Hypertension Brother   . Alcohol abuse Maternal Uncle   . Drug abuse Maternal Uncle   . Alcohol abuse Paternal Uncle    No Known Allergies   Previous Medications   VENLAFAXINE (EFFEXOR) 37.5 MG TABLET    Take 1 tablet (37.5 mg total) by mouth 2 (two) times daily.    Review of Systems  Constitutional: Negative.   Respiratory: Negative.   Cardiovascular: Negative.   Psychiatric/Behavioral: Positive for dysphoric mood. The patient is nervous/anxious.     Social History  Substance Use Topics  . Smoking status: Current Every Day Smoker    Packs/day: 1.00  . Smokeless tobacco: Never Used  . Alcohol use 0.0 oz/week     Comment: OCCASIONALLY   Objective:   BP 118/78 (BP Location: Right Arm, Patient Position: Sitting, Cuff Size: Normal)   Pulse 72   Temp 98.3 F (36.8 C) (Oral)   Resp 16   Wt 186 lb (84.4 kg)   SpO2 96%   BMI 26.69 kg/m  Wt Readings from Last 3 Encounters:  09/20/16 186 lb (84.4 kg)  08/06/16 180 lb (81.6 kg)  06/21/16 187 lb 9.6 oz (85.1 kg)    Physical Exam  Constitutional: He is  oriented to person, place, and time. He appears well-developed and well-nourished. No distress.  HENT:  Head: Normocephalic and atraumatic.  Right Ear: Hearing normal.  Left Ear: Hearing normal.  Nose: Nose normal.  Eyes: Conjunctivae and lids are normal. Right eye exhibits no discharge. Left eye exhibits no discharge. No scleral icterus.  Neck: Neck supple.  Cardiovascular: Normal rate.   Pulmonary/Chest: Effort normal. No respiratory distress.  Musculoskeletal: Normal range of motion.  Neurological: He is alert and oriented to person, place, and time.  Skin: Skin is intact. No lesion and no rash noted.  Psychiatric: His speech is normal and behavior is normal. Thought content normal. His mood appears anxious. He exhibits a depressed mood.      Assessment & Plan:     1. Moderate single current episode of major depressive disorder (HCC) Sleeping better and less depression reactions. Eating better and has discussed his situation with a supervisor at work and they seem to be helpful in times of flare. Will Increase to 75 mg BID to help control anxiety. Recheck in 3 months. - venlafaxine (EFFEXOR) 75 MG tablet; Take 1 tablet (75 mg total) by mouth 2 (two) times daily.  Dispense: 60 tablet; Refill: 3  2. Generalized anxiety disorder Some morning flare of anxiety most days. Will increase Effexor to 75  mg BID and given Buspar 5 mg to use 1-2 tablets a day for flare or panic sensation. Recommend increase in exercise level and counseling. Recheck in 3 months. - venlafaxine (EFFEXOR) 75 MG tablet; Take 1 tablet (75 mg total) by mouth 2 (two) times daily.  Dispense: 60 tablet; Refill: 3 - busPIRone (BUSPAR) 5 MG tablet; Take 1-2 tablets daily as needed for anxiety flares.  Dispense: 30 tablet; Refill: 1

## 2016-12-03 ENCOUNTER — Ambulatory Visit: Payer: Commercial Managed Care - PPO | Admitting: Family Medicine

## 2016-12-06 ENCOUNTER — Ambulatory Visit: Payer: Commercial Managed Care - PPO | Admitting: Family Medicine

## 2016-12-07 ENCOUNTER — Ambulatory Visit (INDEPENDENT_AMBULATORY_CARE_PROVIDER_SITE_OTHER): Payer: Commercial Managed Care - PPO | Admitting: Family Medicine

## 2016-12-07 ENCOUNTER — Encounter: Payer: Self-pay | Admitting: Family Medicine

## 2016-12-07 DIAGNOSIS — F321 Major depressive disorder, single episode, moderate: Secondary | ICD-10-CM

## 2016-12-07 DIAGNOSIS — F411 Generalized anxiety disorder: Secondary | ICD-10-CM

## 2016-12-07 DIAGNOSIS — F410 Panic disorder [episodic paroxysmal anxiety] without agoraphobia: Secondary | ICD-10-CM | POA: Insufficient documentation

## 2016-12-07 MED ORDER — VENLAFAXINE HCL 100 MG PO TABS: 100.0000 mg | ORAL_TABLET | Freq: Two times a day (BID) | ORAL | 3 refills | Status: DC

## 2016-12-07 NOTE — Progress Notes (Signed)
Patient: Dan Jones Male    DOB: [birth year]   36 y.o.   MRN: [id number] Visit Date: 12/07/2016  Today's Provider: Dortha Kern, PA   Chief Complaint  Patient presents with  . Anxiety  . Follow-up   Subjective:    HPI Anxiety  Presents for follow-up visit. Symptoms include depressed mood, nervous/anxious behavior and panic. Symptoms occur most days. Compliance with medications is good (Effexor 75 mg). Patient reports the Effexor is helping stable his mood swings, but feels the Buspar is not helping at all. Patient states he was having a panic attack and took 2 Buspar 5 mg tablets  To see if that would help and it "knocked" him out.   Patient c/o body pain mostly in back area, feeling fatigue all the time, lightheaded and dizzy. He states these symptoms have been ongoing for a while. Patient is also experiencing coldness and tingling in hand and feet while at rest. He states symptoms are not noticeable when moving around. Symptoms started about 1 month ago.   Patient Active Problem List   Diagnosis Date Noted  . History of allergy 03/11/2015  . Tobacco user 03/11/2015  . Drug abuse 03/11/2015  . Insomnia 03/11/2015  . Depression 03/11/2015  . Anxiety disorder 03/11/2015   Past Surgical History:  Procedure Laterality Date  . APPENDECTOMY  2006   Family History  Problem Relation Age of Onset  . Depression Mother   . Depression Father   . Alcohol abuse Brother   . Hypertension Brother   . Alcohol abuse Maternal Uncle   . Drug abuse Maternal Uncle   . Alcohol abuse Paternal Uncle    No Known Allergies   Previous Medications   BUSPIRONE (BUSPAR) 5 MG TABLET    Take 1-2 tablets daily as needed for anxiety flares.   VENLAFAXINE (EFFEXOR) 75 MG TABLET    Take 1 tablet (75 mg total) by mouth 2 (two) times daily.    Review of Systems  Constitutional: Positive for fatigue.  Respiratory: Negative.   Cardiovascular: Negative.   Neurological: Positive for dizziness  and light-headedness.       Tingling   Psychiatric/Behavioral: The patient is nervous/anxious.     Social History  Substance Use Topics  . Smoking status: Current Every Day Smoker    Packs/day: 1.00  . Smokeless tobacco: Never Used  . Alcohol use 0.0 oz/week     Comment: OCCASIONALLY   Objective:   BP 122/82 (BP Location: Right Arm, Patient Position: Sitting, Cuff Size: Normal)   Pulse 92   Temp 98.6 F (37 C) (Oral)   Wt 184 lb 12.8 oz (83.8 kg)   SpO2 98%   BMI 26.52 kg/m   Physical Exam  Constitutional: He is oriented to person, place, and time. He appears well-developed and well-nourished. No distress.  HENT:  Head: Normocephalic and atraumatic.  Right Ear: Hearing normal.  Left Ear: Hearing normal.  Nose: Nose normal.  Eyes: Conjunctivae and lids are normal. Right eye exhibits no discharge. Left eye exhibits no discharge. No scleral icterus.  Pulmonary/Chest: Effort normal. No respiratory distress.  Musculoskeletal: Normal range of motion.  Neurological: He is alert and oriented to person, place, and time.  Skin: Skin is intact. No lesion and no rash noted.  Psychiatric: His speech is normal and behavior is normal. Thought content normal. His mood appears anxious. He exhibits a depressed mood.      Assessment & Plan:     1. Generalized anxiety disorder Having  panic attacks that are not completely controlled on the 75 mg Effexor BID with Buspar 5 mg TID prn. Will increase Effexor to 100 mg BID and use Buspar 5 mg 1.5 tablets prn up to BID. Note written to be out of work 2 weeks to allow medication adjustments to stabilize condition. Stress and personal relationships of an aggressive nature, are worsening symptoms. Follow up in 2 weeks to release back to work and assess progress. - venlafaxine (EFFEXOR) 100 MG tablet; Take 1 tablet (100 mg total) by mouth 2 (two) times daily.  Dispense: 60 tablet; Refill: 3  2. Moderate single current episode of major depressive  disorder (HCC) No suicidal ideation. Sleeping well and good appetite. Will adjust Effexor dosage to help control anxiety and panic better. Recheck in 2 weeks. - venlafaxine (EFFEXOR) 100 MG tablet; Take 1 tablet (100 mg total) by mouth 2 (two) times daily.  Dispense: 60 tablet; Refill: 3

## 2016-12-17 ENCOUNTER — Ambulatory Visit: Payer: Commercial Managed Care - PPO | Admitting: Family Medicine

## 2016-12-23 ENCOUNTER — Ambulatory Visit (INDEPENDENT_AMBULATORY_CARE_PROVIDER_SITE_OTHER): Payer: Commercial Managed Care - PPO | Admitting: Family Medicine

## 2016-12-23 ENCOUNTER — Encounter: Payer: Self-pay | Admitting: Family Medicine

## 2016-12-23 VITALS — BP 118/82 | HR 68 | Temp 97.9°F | Wt 179.6 lb

## 2016-12-23 DIAGNOSIS — F410 Panic disorder [episodic paroxysmal anxiety] without agoraphobia: Secondary | ICD-10-CM

## 2016-12-23 DIAGNOSIS — F411 Generalized anxiety disorder: Secondary | ICD-10-CM | POA: Diagnosis not present

## 2016-12-23 NOTE — Progress Notes (Signed)
   Patient: Dan Jones Male    DOB: 03/06/1981   35 y.o.   MRN: [id number] Visit Date: 12/23/2016  Today's Provider: Dortha Kernennis Chrismon, PA   Chief Complaint  Patient presents with  . Anxiety  . Follow-up   Subjective:    Anxiety  Presents for follow-up visit. Symptoms include confusion, decreased concentration, depressed mood, dizziness, dry mouth, excessive worry, insomnia, irritability, malaise, muscle tension, nervous/anxious behavior, panic, restlessness and shortness of breath. Chest pain: panic attacks. Symptoms occur most days. The severity of symptoms is interfering with daily activities. The quality of sleep is poor. Nighttime awakenings: one to two.   Compliance with medications is 76-100% (Continue Buspar 5 mg 1.5 tablets PRN and increase Effexor to 100 mg BID. Patient states he started taking Buspar 10 mg to see if it would help relieve symptoms. Symptoms are unchanged. ).     Previous Medications   BUSPIRONE (BUSPAR) 5 MG TABLET    Take 1-2 tablets daily as needed for anxiety flares.   VALERIAN ROOT PO    Take by mouth.   VENLAFAXINE (EFFEXOR) 100 MG TABLET    Take 1 tablet (100 mg total) by mouth 2 (two) times daily.    Review of Systems  Constitutional: Positive for irritability.  Respiratory: Positive for shortness of breath.   Cardiovascular: Chest pain: panic attacks.  Neurological: Positive for dizziness.  Psychiatric/Behavioral: Positive for confusion and decreased concentration. The patient is nervous/anxious and has insomnia.     Social History  Substance Use Topics  . Smoking status: Current Every Day Smoker    Packs/day: 1.00  . Smokeless tobacco: Never Used  . Alcohol use 0.0 oz/week     Comment: OCCASIONALLY   Objective:   BP 118/82 (BP Location: Right Arm, Patient Position: Sitting, Cuff Size: Normal)   Pulse 68   Temp 97.9 F (36.6 C) (Oral)   Wt 179 lb 9.6 oz (81.5 kg)   SpO2 97%   BMI 25.77 kg/m  Wt Readings from Last 3 Encounters:   12/23/16 179 lb 9.6 oz (81.5 kg)  12/07/16 184 lb 12.8 oz (83.8 kg)  09/20/16 186 lb (84.4 kg)    Physical Exam  Constitutional: He is oriented to person, place, and time. He appears well-developed and well-nourished.  HENT:  Head: Normocephalic.  Eyes: Conjunctivae are normal.  Neck: Neck supple.  Cardiovascular: Normal rate.   Pulmonary/Chest: Effort normal.  Neurological: He is alert and oriented to person, place, and time.  Psychiatric: His behavior is normal. Thought content normal. His mood appears anxious.      Assessment & Plan:     1. Generalized anxiety disorder with panic attacks Not much help with anxiety/panic using the Buspar. Continues to take the Effexor 100 mg BID and feeling more energized. Sleep was a little disturbed and tried the Valerian Root at bedtime. May increase Valerian Root to 120 mg 2-3 times a day and add Melatonin 3 mg at bedtime to get more sustained sleep. Stop the Buspar and may return to work 12-27-16. Recheck prn.

## 2016-12-28 ENCOUNTER — Ambulatory Visit (INDEPENDENT_AMBULATORY_CARE_PROVIDER_SITE_OTHER): Payer: Commercial Managed Care - PPO | Admitting: Family Medicine

## 2016-12-28 ENCOUNTER — Encounter: Payer: Self-pay | Admitting: Family Medicine

## 2016-12-28 VITALS — BP 114/82 | HR 68 | Temp 98.1°F | Resp 16 | Wt 184.0 lb

## 2016-12-28 DIAGNOSIS — F321 Major depressive disorder, single episode, moderate: Secondary | ICD-10-CM

## 2016-12-28 DIAGNOSIS — F411 Generalized anxiety disorder: Secondary | ICD-10-CM | POA: Diagnosis not present

## 2016-12-28 DIAGNOSIS — F410 Panic disorder [episodic paroxysmal anxiety] without agoraphobia: Secondary | ICD-10-CM

## 2016-12-28 MED ORDER — TRAZODONE HCL 50 MG PO TABS
25.0000 mg | ORAL_TABLET | Freq: Every evening | ORAL | 3 refills | Status: DC | PRN
Start: 2016-12-28 — End: 2020-05-14

## 2016-12-28 NOTE — Progress Notes (Signed)
Patient: Dan Jones Male    DOB: [birth year]   36 y.o.   MRN: [id number] Visit Date: 12/28/2016  Today's Provider: Dortha Kernennis Nelta Caudill, PA   Chief Complaint  Patient presents with  . Anxiety   Subjective:    Anxiety  Presents for follow-up visit. Symptoms include chest pain, compulsions, confusion, decreased concentration, depressed mood, dizziness, dry mouth, excessive worry, hyperventilation, insomnia, irritability, malaise, muscle tension, nervous/anxious behavior, obsessions, palpitations, panic (Pt reports he had a panic attack this morning. Pt felt like the medicaion was helping his anxiety, until he had this panic attack), restlessness and shortness of breath. Patient reports no feeling of choking, impotence, nausea or suicidal ideas. The severity of symptoms is severe, causing significant distress and interfering with daily activities. The patient sleeps 5 hours per night. The quality of sleep is non-restorative. Nighttime awakenings: one to two.   Compliance with medications is 76-100%. Treatment side effects: Effexor is causing Tinnitus, forgetfulness, irritability, diaphoresis, mood swings.   Patient Active Problem List   Diagnosis Date Noted  . Generalized anxiety disorder with panic attacks 12/07/2016  . History of allergy 03/11/2015  . Tobacco user 03/11/2015  . Drug abuse 03/11/2015  . Insomnia 03/11/2015  . Depression 03/11/2015  . Anxiety disorder 03/11/2015   Family History  Problem Relation Age of Onset  . Depression Mother   . Depression Father   . Alcohol abuse Brother   . Hypertension Brother   . Alcohol abuse Maternal Uncle   . Drug abuse Maternal Uncle   . Alcohol abuse Paternal Uncle    No Known Allergies  Current Outpatient Prescriptions:  Marland Kitchen.  Melatonin 3 MG TABS, Take 1 tablet by mouth at bedtime as needed., Disp: , Rfl:  .  VALERIAN ROOT PO, Take by mouth., Disp: , Rfl:  .  venlafaxine (EFFEXOR) 100 MG tablet, Take 1 tablet (100 mg  total) by mouth 2 (two) times daily., Disp: 60 tablet, Rfl: 3  Review of Systems  Constitutional: Positive for activity change, appetite change (decreased), diaphoresis, fatigue, irritability and unexpected weight change. Negative for chills and fever.  HENT: Positive for tinnitus.   Respiratory: Positive for shortness of breath.   Cardiovascular: Positive for chest pain and palpitations.  Gastrointestinal: Negative for nausea.  Genitourinary: Negative for impotence.  Neurological: Positive for dizziness.  Psychiatric/Behavioral: Positive for confusion, decreased concentration and sleep disturbance. Negative for suicidal ideas. The patient is nervous/anxious and has insomnia.     Social History  Substance Use Topics  . Smoking status: Current Every Day Smoker    Packs/day: 1.00  . Smokeless tobacco: Never Used  . Alcohol use 0.0 oz/week     Comment: OCCASIONALLY   Objective:   BP 114/82 (BP Location: Right Arm, Patient Position: Sitting, Cuff Size: Large)   Pulse 68   Temp 98.1 F (36.7 C) (Oral)   Resp 16   Wt 184 lb (83.5 kg)   BMI 26.40 kg/m  Vitals:   12/28/16 1441  BP: 114/82  Pulse: 68  Resp: 16  Temp: 98.1 F (36.7 C)  TempSrc: Oral  Weight: 184 lb (83.5 kg)   Physical Exam  Constitutional: He is oriented to person, place, and time. He appears well-developed and well-nourished. No distress.  HENT:  Head: Normocephalic and atraumatic.  Right Ear: Hearing normal.  Left Ear: Hearing normal.  Nose: Nose normal.  Eyes: Conjunctivae and lids are normal. Right eye exhibits no discharge. Left eye exhibits no discharge. No scleral icterus.  Cardiovascular: Normal rate and regular rhythm.   Pulmonary/Chest: Effort normal. No respiratory distress.  Musculoskeletal: Normal range of motion.  Neurological: He is alert and oriented to person, place, and time.  Skin: Skin is intact. No lesion and no rash noted.  Psychiatric: His speech is normal and behavior is normal.  Thought content normal. His mood appears anxious. He exhibits a depressed mood. He expresses no suicidal plans and no homicidal plans.      Assessment & Plan:      1. Generalized anxiety disorder with panic attacks Had another flare of anxiety and/or panic this morning. Feels it is related to going back to work in a hostile environment. Does not feel the Effexor with the Buspar is controlling symptoms very well. Recommend psychiatry referral. - traZODone (DESYREL) 50 MG tablet; Take 0.5-1 tablets (25-50 mg total) by mouth at bedtime as needed for sleep.  Dispense: 30 tablet; Refill: 3 - Ambulatory referral to Psychiatry  2. Current moderate episode of major depressive disorder without prior episode (HCC) More sadness and some spontaneous crying spell this morning with anxiety. Feels he should change workplace due to hostile environment with co-workers. Will add Trazodone and slowly taper off the Effexor. Schedule psychiatry referral and call report of progress in 4 days. - traZODone (DESYREL) 50 MG tablet; Take 0.5-1 tablets (25-50 mg total) by mouth at bedtime as needed for sleep.  Dispense: 30 tablet; Refill: 3 - Ambulatory referral to Psychiatry       Dortha Kern, PA  [facility]

## 2017-01-13 ENCOUNTER — Ambulatory Visit: Payer: Commercial Managed Care - PPO | Admitting: Behavioral Health

## 2017-02-02 ENCOUNTER — Ambulatory Visit: Payer: Commercial Managed Care - PPO | Admitting: Behavioral Health

## 2020-03-24 ENCOUNTER — Telehealth: Payer: Self-pay

## 2020-03-24 NOTE — Telephone Encounter (Signed)
Yes I told Dan Jones I would take him on. He wouldn't be a new patient as he is established with Dan Jones, he is just changing to me.   We can schedule him at whatever next available CPE appt I have that work for him as well.

## 2020-03-24 NOTE — Telephone Encounter (Signed)
appt has been scheduled for 04/25/20 at 8AM.KW

## 2020-03-24 NOTE — Telephone Encounter (Signed)
Copied from CRM [id number]. Topic: General - Other >> Mar 24, 2020  8:24 AM Tamela Oddi wrote: Reason for CRM: Patient's wife called Helmut Muster) to ask if patient could become a patient of Dr. Rosezetta Schlatter.  She stated that she asked the doctor in early August and she agreed to take him on.  Patient needs a physical appt. And would like a call back to confirm that he can be seen by Dr. Rosezetta Schlatter.  Please call wife at [phone]

## 2020-03-24 NOTE — Telephone Encounter (Signed)
Do you recall if you agreed to take this patient on as a new patient? If not I will see if Marcelino Duster has any upcoming appts at the end of this month for new patients. KW

## 2020-04-10 NOTE — Progress Notes (Signed)
Complete physical exam   Patient: Dan Jones   DOB: 10-07-1980   39 y.o. Male  MRN: [id number] Visit Date: 04/11/2020  Today's healthcare provider: Margaretann Loveless, PA-C   Chief Complaint  Patient presents with  . Annual Exam   Subjective    Dan Jones is a 39 y.o. male who presents today for a complete physical exam.  He reports consuming a general diet. The patient does not participate in regular exercise at present. He generally feels fairly well. He reports sleeping fairly well. He does have additional problems to discuss today.  HPI   History reviewed. No pertinent past medical history. Past Surgical History:  Procedure Laterality Date  . APPENDECTOMY  2006   Social History   Socioeconomic History  . Marital status: Married    Spouse name: Not on file  . Number of children: Not on file  . Years of education: Not on file  . Highest education level: Not on file  Occupational History  . Not on file  Tobacco Use  . Smoking status: Current Every Day Smoker    Packs/day: 2.00    Types: Cigarettes  . Smokeless tobacco: Never Used  Vaping Use  . Vaping Use: Never used  Substance and Sexual Activity  . Alcohol use: Not Currently    Alcohol/week: 0.0 standard drinks    Comment: OCCASIONALLY  . Drug use: Yes    Comment: USES MARIJUANA MORE THAN ONCE DAILY- 10 YEARS, HIGH TOLERANCE Couple of times a week  . Sexual activity: Yes  Other Topics Concern  . Not on file  Social History Narrative  . Not on file   Social Determinants of Health   Financial Resource Strain:   . Difficulty of Paying Living Expenses: Not on file  Food Insecurity:   . Worried About Programme researcher, broadcasting/film/video in the Last Year: Not on file  . Ran Out of Food in the Last Year: Not on file  Transportation Needs:   . Lack of Transportation (Medical): Not on file  . Lack of Transportation (Non-Medical): Not on file  Physical Activity:   . Days of Exercise per Week: Not on  file  . Minutes of Exercise per Session: Not on file  Stress:   . Feeling of Stress : Not on file  Social Connections:   . Frequency of Communication with Friends and Family: Not on file  . Frequency of Social Gatherings with Friends and Family: Not on file  . Attends Religious Services: Not on file  . Active Member of Clubs or Organizations: Not on file  . Attends Banker Meetings: Not on file  . Marital Status: Not on file  Intimate Partner Violence:   . Fear of Current or Ex-Partner: Not on file  . Emotionally Abused: Not on file  . Physically Abused: Not on file  . Sexually Abused: Not on file   Family Status  Relation Name Status  . Mother  Alive  . Father  Alive  . Brother  Alive  . Mat [employer]  . Oneal Grout  Alive   Family History  Problem Relation Age of Onset  . Depression Mother   . Cancer Mother        cervical  . Hypertension Mother   . Anxiety disorder Mother   . Depression Father   . Alcohol abuse Brother   . Hypertension Brother   . Anxiety disorder Brother   . Alcohol abuse Maternal  Uncle   . Drug abuse Maternal Uncle   . Alcohol abuse Paternal Uncle    No Known Allergies  Patient Care Team: Chrismon, Jodell Ciproennis E, PA as PCP - General (Physician Assistant)   Medications: Outpatient Medications Prior to Visit  Medication Sig  . VALERIAN ROOT PO Take by mouth.  . Melatonin 3 MG TABS Take 1 tablet by mouth at bedtime as needed. (Patient not taking: Reported on 04/11/2020)  . traZODone (DESYREL) 50 MG tablet Take 0.5-1 tablets (25-50 mg total) by mouth at bedtime as needed for sleep. (Patient not taking: Reported on 04/11/2020)   No facility-administered medications prior to visit.    Review of Systems  Constitutional: Positive for fatigue.  HENT: Positive for congestion, sinus pressure and tinnitus.   Eyes: Positive for photophobia.  Respiratory: Negative.   Cardiovascular: Positive for leg swelling.  Gastrointestinal: Negative.     Endocrine: Positive for cold intolerance, polydipsia and polyuria.  Genitourinary: Negative.   Musculoskeletal: Positive for arthralgias, back pain and myalgias.  Skin: Positive for rash.  Allergic/Immunologic: Positive for environmental allergies.  Neurological: Positive for dizziness, light-headedness and headaches.  Hematological: Negative.   Psychiatric/Behavioral: Positive for agitation, decreased concentration and sleep disturbance. The patient is nervous/anxious and is hyperactive.     Last CBC Lab Results  Component Value Date   WBC 8.1 03/31/2016   HGB 14.3 03/31/2016   HCT 43.0 03/31/2016   MCV 94 03/31/2016   MCH 31.4 03/31/2016   RDW 13.4 03/31/2016   PLT 206 03/31/2016   Last metabolic panel Lab Results  Component Value Date   GLUCOSE 98 03/31/2016   NA 142 03/31/2016   K 4.3 03/31/2016   CL 102 03/31/2016   CO2 25 03/31/2016   BUN 13 03/31/2016   CREATININE 0.90 03/31/2016   GFRNONAA 111 03/31/2016   GFRAA 128 03/31/2016   CALCIUM 9.3 03/31/2016   PROT 7.1 03/31/2016   ALBUMIN 4.7 03/31/2016   LABGLOB 2.4 03/31/2016   AGRATIO 2.0 03/31/2016   BILITOT 0.5 03/31/2016   ALKPHOS 50 03/31/2016   AST 19 03/31/2016   ALT 14 03/31/2016      Objective    BP 120/80 (BP Location: Right Arm, Patient Position: Sitting, Cuff Size: Large)   Pulse 66   Temp 98.2 F (36.8 C) (Oral)   Resp 16   Ht 5\' 8"  (1.727 m)   Wt 204 lb 9.6 oz (92.8 kg)   BMI 31.11 kg/m  BP Readings from Last 3 Encounters:  04/11/20 120/80  12/28/16 114/82  12/23/16 118/82   Wt Readings from Last 3 Encounters:  04/11/20 204 lb 9.6 oz (92.8 kg)  12/28/16 184 lb (83.5 kg)  12/23/16 179 lb 9.6 oz (81.5 kg)      Physical Exam Vitals reviewed.  Constitutional:      General: He is not in acute distress.    Appearance: Normal appearance. He is well-developed. He is obese. He is not ill-appearing.  HENT:     Head: Normocephalic and atraumatic.     Right Ear: Tympanic membrane, ear  canal and external ear normal.     Left Ear: Tympanic membrane, ear canal and external ear normal.     Nose: Nose normal.     Mouth/Throat:     Mouth: Mucous membranes are moist.     Pharynx: Oropharynx is clear. No oropharyngeal exudate.  Eyes:     General: No scleral icterus.       Right eye: No discharge.  Left eye: No discharge.     Extraocular Movements: Extraocular movements intact.     Conjunctiva/sclera: Conjunctivae normal.     Pupils: Pupils are equal, round, and reactive to light.  Neck:     Thyroid: No thyromegaly.     Trachea: No tracheal deviation.  Cardiovascular:     Rate and Rhythm: Normal rate and regular rhythm.     Pulses: Normal pulses.     Heart sounds: Normal heart sounds. No murmur heard.   Pulmonary:     Effort: Pulmonary effort is normal. No respiratory distress.     Breath sounds: Normal breath sounds. No wheezing or rales.  Chest:     Chest wall: No tenderness.  Abdominal:     General: Abdomen is flat. Bowel sounds are normal. There is no distension.     Palpations: Abdomen is soft. There is no mass.     Tenderness: There is no abdominal tenderness. There is no guarding or rebound.  Musculoskeletal:        General: No tenderness. Normal range of motion.     Cervical back: Normal range of motion and neck supple.     Right lower leg: No edema.     Left lower leg: No edema.  Lymphadenopathy:     Cervical: No cervical adenopathy.  Skin:    General: Skin is warm and dry.     Capillary Refill: Capillary refill takes less than 2 seconds.     Findings: No erythema or rash.  Neurological:     General: No focal deficit present.     Mental Status: He is alert and oriented to person, place, and time. Mental status is at baseline.     Cranial Nerves: No cranial nerve deficit.     Motor: No abnormal muscle tone.     Coordination: Coordination normal.     Deep Tendon Reflexes: Reflexes are normal and symmetric. Reflexes normal.  Psychiatric:         Mood and Affect: Mood normal.        Behavior: Behavior normal.        Thought Content: Thought content normal.        Judgment: Judgment normal.     Last depression screening scores PHQ 2/9 Scores 04/11/2020  PHQ - 2 Score 2  PHQ- 9 Score 13   Last fall risk screening Fall Risk  04/11/2020  Falls in the past year? 0  Number falls in past yr: 0  Injury with Fall? 0  Risk for fall due to : No Fall Risks  Follow up Falls evaluation completed   Last Audit-C alcohol use screening Alcohol Use Disorder Test (AUDIT) 04/11/2020  1. How often do you have a drink containing alcohol? 0  2. How many drinks containing alcohol do you have on a typical day when you are drinking? 0  3. How often do you have six or more drinks on one occasion? 0  AUDIT-C Score 0  Alcohol Brief Interventions/Follow-up AUDIT Score <7 follow-up not indicated   A score of 3 or more in women, and 4 or more in men indicates increased risk for alcohol abuse, EXCEPT if all of the points are from question 1   No results found for any visits on 04/11/20.  Assessment & Plan    Routine Health Maintenance and Physical Exam  Exercise Activities and Dietary recommendations Goals   None     Immunization History  Administered Date(s) Administered  . Tdap 04/11/2020    Health  Maintenance  Topic Date Due  . Hepatitis C Screening  Never done  . COVID-19 Vaccine (1) Never done  . HIV Screening  Never done  . INFLUENZA VACCINE  11/13/2020 (Originally 03/16/2020)  . TETANUS/TDAP  04/11/2030    Discussed health benefits of physical activity, and encouraged him to engage in regular exercise appropriate for his age and condition.  1. Encounter for annual physical exam Normal physical exam today. Will check labs as below and f/u pending lab results. If labs are stable and WNL he will not need to have these rechecked for one year at his next annual physical exam. He is to call the office in the meantime if he has any acute  issue, questions or concerns. - CBC with Differential/Platelet - Comprehensive metabolic panel - Lipid panel - Hemoglobin A1c - TSH  2. Screening for HIV without presence of risk factors Will check labs as below and f/u pending results. - HIV Antibody (routine testing w rflx)  3. Encounter for hepatitis C screening test for low risk patient Will check labs as below and f/u pending results. - Hepatitis C antibody  4. Family history of diabetes mellitus Will check labs as below and f/u pending results. - Hemoglobin A1c  5. Class 1 obesity due to excess calories with body mass index (BMI) of 31.0 to 31.9 in adult, unspecified whether serious comorbidity present Counseled patient on healthy lifestyle modifications including dieting and exercise.  Will check labs as below and f/u pending results. - Lipid panel - Hemoglobin A1c  6. Need for tetanus, diphtheria, and acellular pertussis (Tdap) vaccine Tdap Vaccine given to patient without complications. Patient sat for 15 minutes after administration and was tolerated well without adverse effects. - Tdap vaccine greater than or equal to 7yo IM  7. Influenza vaccination declined Declined.  8. Psoriasis Noted on legs bilaterally. Will try mometasone cream as below. F/U in 4-6 weeks to check legs and also will f/u anxiety at follow up as well.  - mometasone (ELOCON) 0.1 % cream; Apply 1 application topically daily.  Dispense: 45 g; Refill: 0   Return in about 4 weeks (around 05/09/2020) for dperession, anxiety.     Delmer Islam, PA-C, have reviewed all documentation for this visit. The documentation on 04/15/20 for the exam, diagnosis, procedures, and orders are all accurate and complete.   Reine Just  [facility] [phone] (phone) [phone] (fax)  [facility]

## 2020-04-11 ENCOUNTER — Encounter: Payer: Self-pay | Admitting: Physician Assistant

## 2020-04-11 ENCOUNTER — Other Ambulatory Visit: Payer: Self-pay

## 2020-04-11 ENCOUNTER — Ambulatory Visit (INDEPENDENT_AMBULATORY_CARE_PROVIDER_SITE_OTHER): Payer: Commercial Managed Care - PPO | Admitting: Physician Assistant

## 2020-04-11 VITALS — BP 120/80 | HR 66 | Temp 98.2°F | Resp 16 | Ht 68.0 in | Wt 204.6 lb

## 2020-04-11 DIAGNOSIS — Z833 Family history of diabetes mellitus: Secondary | ICD-10-CM

## 2020-04-11 DIAGNOSIS — L409 Psoriasis, unspecified: Secondary | ICD-10-CM

## 2020-04-11 DIAGNOSIS — Z1159 Encounter for screening for other viral diseases: Secondary | ICD-10-CM | POA: Diagnosis not present

## 2020-04-11 DIAGNOSIS — E6609 Other obesity due to excess calories: Secondary | ICD-10-CM

## 2020-04-11 DIAGNOSIS — Z0000 Encounter for general adult medical examination without abnormal findings: Secondary | ICD-10-CM | POA: Diagnosis not present

## 2020-04-11 DIAGNOSIS — Z23 Encounter for immunization: Secondary | ICD-10-CM | POA: Diagnosis not present

## 2020-04-11 DIAGNOSIS — Z6831 Body mass index (BMI) 31.0-31.9, adult: Secondary | ICD-10-CM

## 2020-04-11 DIAGNOSIS — Z114 Encounter for screening for human immunodeficiency virus [HIV]: Secondary | ICD-10-CM | POA: Diagnosis not present

## 2020-04-11 DIAGNOSIS — Z2821 Immunization not carried out because of patient refusal: Secondary | ICD-10-CM

## 2020-04-11 MED ORDER — MOMETASONE FUROATE 0.1 % EX CREA: 1.0000 "application " | TOPICAL_CREAM | Freq: Every day | CUTANEOUS | 0 refills | Status: DC

## 2020-04-11 NOTE — Patient Instructions (Signed)

## 2020-04-16 ENCOUNTER — Encounter: Payer: Self-pay | Admitting: Physician Assistant

## 2020-04-16 ENCOUNTER — Telehealth: Payer: Self-pay

## 2020-04-16 DIAGNOSIS — E034 Atrophy of thyroid (acquired): Secondary | ICD-10-CM

## 2020-04-16 LAB — HEMOGLOBIN A1C
Est. average glucose Bld gHb Est-mCnc: 100 mg/dL
Hgb A1c MFr Bld: 5.1 % (ref 4.8–5.6)

## 2020-04-16 LAB — CBC WITH DIFFERENTIAL/PLATELET
Basophils Absolute: 0 10*3/uL (ref 0.0–0.2)
Basos: 0 %
EOS (ABSOLUTE): 0.1 10*3/uL (ref 0.0–0.4)
Eos: 1 %
Hematocrit: 42.4 % (ref 37.5–51.0)
Hemoglobin: 14.4 g/dL (ref 13.0–17.7)
Immature Grans (Abs): 0 10*3/uL (ref 0.0–0.1)
Immature Granulocytes: 0 %
Lymphocytes Absolute: 1.8 10*3/uL (ref 0.7–3.1)
Lymphs: 20 %
MCH: 32.1 pg (ref 26.6–33.0)
MCHC: 34 g/dL (ref 31.5–35.7)
MCV: 95 fL (ref 79–97)
Monocytes Absolute: 0.7 10*3/uL (ref 0.1–0.9)
Monocytes: 8 %
Neutrophils Absolute: 6.3 10*3/uL (ref 1.4–7.0)
Neutrophils: 71 %
Platelets: 177 10*3/uL (ref 150–450)
RBC: 4.48 x10E6/uL (ref 4.14–5.80)
RDW: 12.3 % (ref 11.6–15.4)
WBC: 9 10*3/uL (ref 3.4–10.8)

## 2020-04-16 LAB — COMPREHENSIVE METABOLIC PANEL
ALT: 12 IU/L (ref 0–44)
AST: 14 IU/L (ref 0–40)
Albumin/Globulin Ratio: 2.1 (ref 1.2–2.2)
Albumin: 4.7 g/dL (ref 4.0–5.0)
Alkaline Phosphatase: 67 IU/L (ref 48–121)
BUN/Creatinine Ratio: 9 (ref 9–20)
BUN: 9 mg/dL (ref 6–20)
Bilirubin Total: 0.5 mg/dL (ref 0.0–1.2)
CO2: 23 mmol/L (ref 20–29)
Calcium: 9.4 mg/dL (ref 8.7–10.2)
Chloride: 102 mmol/L (ref 96–106)
Creatinine, Ser: 0.96 mg/dL (ref 0.76–1.27)
GFR calc Af Amer: 115 mL/min/{1.73_m2} (ref >59)
GFR calc non Af Amer: 100 mL/min/{1.73_m2} (ref >59)
Globulin, Total: 2.2 g/dL (ref 1.5–4.5)
Glucose: 96 mg/dL (ref 65–99)
Potassium: 4.2 mmol/L (ref 3.5–5.2)
Sodium: 139 mmol/L (ref 134–144)
Total Protein: 6.9 g/dL (ref 6.0–8.5)

## 2020-04-16 LAB — LIPID PANEL
Chol/HDL Ratio: 4.4 ratio (ref 0.0–5.0)
Cholesterol, Total: 179 mg/dL (ref 100–199)
HDL: 41 mg/dL (ref >39)
LDL Chol Calc (NIH): 117 mg/dL — ABNORMAL HIGH (ref 0–99)
Triglycerides: 115 mg/dL (ref 0–149)
VLDL Cholesterol Cal: 21 mg/dL (ref 5–40)

## 2020-04-16 LAB — TSH: TSH: 5.75 u[IU]/mL — ABNORMAL HIGH (ref 0.450–4.500)

## 2020-04-16 LAB — HIV ANTIBODY (ROUTINE TESTING W REFLEX): HIV Screen 4th Generation wRfx: NONREACTIVE

## 2020-04-16 LAB — HEPATITIS C ANTIBODY: Hep C Virus Ab: <0.1 s/co ratio (ref 0.0–0.9)

## 2020-04-16 NOTE — Telephone Encounter (Signed)
Written by Margaretann Loveless, PA-C on 04/16/2020 9:40 AM EDT Seen by patient Dan Jones on 04/16/2020 9:58 AM

## 2020-04-16 NOTE — Telephone Encounter (Signed)
-----   Message from Margaretann Loveless, [place] sent at 04/16/2020  9:40 AM EDT ----- Blood count is normal. Kidney and liver function are normal. Sodium, potassium, and calcium are normal. Cholesterol fairly good, LDL (bad) cholesterol is borderline elevated. A1c/Sugar is normal. Thyroid does appear to be slightly underactive. May benefit from low dose levothyroxine. If desired we can send in. Hepatitis C screen is negative. HIV screen is negative.

## 2020-04-17 ENCOUNTER — Encounter: Payer: Self-pay | Admitting: Physician Assistant

## 2020-04-17 MED ORDER — LEVOTHYROXINE SODIUM 25 MCG PO TABS: 25.0000 ug | ORAL_TABLET | Freq: Every day | ORAL | 0 refills | Status: DC

## 2020-04-25 ENCOUNTER — Encounter: Payer: Self-pay | Admitting: Physician Assistant

## 2020-04-29 ENCOUNTER — Encounter: Payer: Self-pay | Admitting: Physician Assistant

## 2020-04-29 DIAGNOSIS — Z20822 Contact with and (suspected) exposure to covid-19: Secondary | ICD-10-CM

## 2020-05-02 ENCOUNTER — Telehealth: Payer: Self-pay

## 2020-05-02 LAB — SARS-COV-2, NAA 2 DAY TAT

## 2020-05-02 LAB — NOVEL CORONAVIRUS, NAA: SARS-CoV-2, NAA: NOT DETECTED

## 2020-05-02 NOTE — Telephone Encounter (Signed)
-----   Message from Margaretann Loveless, PA-C sent at 05/02/2020  8:53 AM EDT ----- Covid testing is negative.

## 2020-05-02 NOTE — Telephone Encounter (Signed)
Seen by patient Curley Spice on 05/02/2020 8:55 AM

## 2020-05-14 ENCOUNTER — Encounter: Payer: Self-pay | Admitting: Physician Assistant

## 2020-05-14 ENCOUNTER — Other Ambulatory Visit: Payer: Self-pay

## 2020-05-14 ENCOUNTER — Ambulatory Visit (INDEPENDENT_AMBULATORY_CARE_PROVIDER_SITE_OTHER): Payer: Commercial Managed Care - PPO | Admitting: Physician Assistant

## 2020-05-14 VITALS — BP 109/81 | HR 78 | Temp 98.9°F | Resp 16 | Wt 202.6 lb

## 2020-05-14 DIAGNOSIS — F411 Generalized anxiety disorder: Secondary | ICD-10-CM | POA: Diagnosis not present

## 2020-05-14 DIAGNOSIS — F321 Major depressive disorder, single episode, moderate: Secondary | ICD-10-CM | POA: Diagnosis not present

## 2020-05-14 DIAGNOSIS — F5101 Primary insomnia: Secondary | ICD-10-CM | POA: Diagnosis not present

## 2020-05-14 DIAGNOSIS — R21 Rash and other nonspecific skin eruption: Secondary | ICD-10-CM

## 2020-05-14 DIAGNOSIS — R0981 Nasal congestion: Secondary | ICD-10-CM

## 2020-05-14 DIAGNOSIS — F410 Panic disorder [episodic paroxysmal anxiety] without agoraphobia: Secondary | ICD-10-CM

## 2020-05-14 MED ORDER — SERTRALINE HCL 50 MG PO TABS: 50.0000 mg | ORAL_TABLET | Freq: Every day | ORAL | 3 refills | Status: DC

## 2020-05-14 MED ORDER — BETAMETHASONE DIPROPIONATE AUG 0.05 % EX CREA: TOPICAL_CREAM | Freq: Two times a day (BID) | CUTANEOUS | 0 refills | Status: AC

## 2020-05-14 MED ORDER — HYDROXYZINE HCL 25 MG PO TABS: 25.0000 mg | ORAL_TABLET | Freq: Three times a day (TID) | ORAL | 1 refills | Status: AC | PRN

## 2020-05-14 NOTE — Patient Instructions (Signed)
Hydroxyzine capsules or tablets What is this medicine? HYDROXYZINE (hye Chelsea i zeen) is an antihistamine. This medicine is used to treat allergy symptoms. It is also used to treat anxiety and tension. This medicine can be used with other medicines to induce sleep before surgery. This medicine may be used for other purposes; ask your health care provider or pharmacist if you have questions. COMMON BRAND NAME(S): ANX, Atarax, Rezine, Vistaril What should I tell my health care provider before I take this medicine? They need to know if you have any of these conditions:  glaucoma  heart disease  history of irregular heartbeat  kidney disease  liver disease  lung or breathing disease, like asthma  stomach or intestine problems  thyroid disease  trouble passing urine  an unusual or allergic reaction to hydroxyzine, cetirizine, other medicines, foods, dyes or preservatives  pregnant or trying to get pregnant  breast-feeding How should I use this medicine? Take this medicine by mouth with a full glass of water. Follow the directions on the prescription label. You may take this medicine with food or on an empty stomach. Take your medicine at regular intervals. Do not take your medicine more often than directed. Talk to your pediatrician regarding the use of this medicine in children. Special care may be needed. While this drug may be prescribed for children as young as 38 years of age for selected conditions, precautions do apply. Patients over 15 years old may have a stronger reaction and need a smaller dose. Overdosage: If you think you have taken too much of this medicine contact a poison control center or emergency room at once. NOTE: This medicine is only for you. Do not share this medicine with others. What if I miss a dose? If you miss a dose, take it as soon as you can. If it is almost time for your next dose, take only that dose. Do not take double or extra doses. What may  interact with this medicine? Do not take this medicine with any of the following medications:  cisapride  dronedarone  pimozide  thioridazine This medicine may also interact with the following medications:  alcohol  antihistamines for allergy, cough, and cold  atropine  barbiturate medicines for sleep or seizures, like phenobarbital  certain antibiotics like erythromycin or clarithromycin  certain medicines for anxiety or sleep  certain medicines for bladder problems like oxybutynin, tolterodine  certain medicines for depression or psychotic disturbances  certain medicines for irregular heart beat  certain medicines for Parkinson's disease like benztropine, trihexyphenidyl  certain medicines for seizures like phenobarbital, primidone  certain medicines for stomach problems like dicyclomine, hyoscyamine  certain medicines for travel sickness like scopolamine  ipratropium  narcotic medicines for pain  other medicines that prolong the QT interval (an abnormal heart rhythm) like dofetilide This list may not describe all possible interactions. Give your health care provider a list of all the medicines, herbs, non-prescription drugs, or dietary supplements you use. Also tell them if you smoke, drink alcohol, or use illegal drugs. Some items may interact with your medicine. What should I watch for while using this medicine? Tell your doctor or health care professional if your symptoms do not improve. You may get drowsy or dizzy. Do not drive, use machinery, or do anything that needs mental alertness until you know how this medicine affects you. Do not stand or sit up quickly, especially if you are an older patient. This reduces the risk of dizzy or fainting spells. Alcohol may  interfere with the effect of this medicine. Avoid alcoholic drinks. Your mouth may get dry. Chewing sugarless gum or sucking hard candy, and drinking plenty of water may help. Contact your doctor if the  problem does not go away or is severe. This medicine may cause dry eyes and blurred vision. If you wear contact lenses you may feel some discomfort. Lubricating drops may help. See your eye doctor if the problem does not go away or is severe. If you are receiving skin tests for allergies, tell your doctor you are using this medicine. What side effects may I notice from receiving this medicine? Side effects that you should report to your doctor or health care professional as soon as possible:  allergic reactions like skin rash, itching or hives, swelling of the face, lips, or tongue  changes in vision  confusion  fast, irregular heartbeat  seizures  tremor  trouble passing urine or change in the amount of urine Side effects that usually do not require medical attention (report to your doctor or health care professional if they continue or are bothersome):  constipation  drowsiness  dry mouth  headache  tiredness This list may not describe all possible side effects. Call your doctor for medical advice about side effects. You may report side effects to FDA at 1-800-FDA-1088. Where should I keep my medicine? Keep out of the reach of children. Store at room temperature between 15 and 30 degrees C (59 and 86 degrees F). Keep container tightly closed. Throw away any unused medicine after the expiration date. NOTE: This sheet is a summary. It may not cover all possible information. If you have questions about this medicine, talk to your doctor, pharmacist, or health care provider.  2020 Elsevier/Gold Standard (2018-07-24 13:19:55)   Sertraline tablets What is this medicine? SERTRALINE (SER tra leen) is used to treat depression. It may also be used to treat obsessive compulsive disorder, panic disorder, post-trauma stress, premenstrual dysphoric disorder (PMDD) or social anxiety. This medicine may be used for other purposes; ask your health care provider or pharmacist if you have  questions. COMMON BRAND NAME(S): Zoloft What should I tell my health care provider before I take this medicine? They need to know if you have any of these conditions:  bleeding disorders  bipolar disorder or a family history of bipolar disorder  glaucoma  heart disease  high blood pressure  history of irregular heartbeat  history of low levels of calcium, magnesium, or potassium in the blood  if you often drink alcohol  liver disease  receiving electroconvulsive therapy  seizures  suicidal thoughts, plans, or attempt; a previous suicide attempt by you or a family member  take medicines that treat or prevent blood clots  thyroid disease  an unusual or allergic reaction to sertraline, other medicines, foods, dyes, or preservatives  pregnant or trying to get pregnant  breast-feeding How should I use this medicine? Take this medicine by mouth with a glass of water. Follow the directions on the prescription label. You can take it with or without food. Take your medicine at regular intervals. Do not take your medicine more often than directed. Do not stop taking this medicine suddenly except upon the advice of your doctor. Stopping this medicine too quickly may cause serious side effects or your condition may worsen. A special MedGuide will be given to you by the pharmacist with each prescription and refill. Be sure to read this information carefully each time. Talk to your pediatrician regarding the use  of this medicine in children. While this drug may be prescribed for children as young as 7 years for selected conditions, precautions do apply. Overdosage: If you think you have taken too much of this medicine contact a poison control center or emergency room at once. NOTE: This medicine is only for you. Do not share this medicine with others. What if I miss a dose? If you miss a dose, take it as soon as you can. If it is almost time for your next dose, take only that dose. Do  not take double or extra doses. What may interact with this medicine? Do not take this medicine with any of the following medications:  cisapride  dronedarone  linezolid  MAOIs like Carbex, Eldepryl, Marplan, Nardil, and Parnate  methylene blue (injected into a vein)  pimozide  thioridazine This medicine may also interact with the following medications:  alcohol  amphetamines  aspirin and aspirin-like medicines  certain medicines for depression, anxiety, or psychotic disturbances  certain medicines for fungal infections like ketoconazole, fluconazole, posaconazole, and itraconazole  certain medicines for irregular heart beat like flecainide, quinidine, propafenone  certain medicines for migraine headaches like almotriptan, eletriptan, frovatriptan, naratriptan, rizatriptan, sumatriptan, zolmitriptan  certain medicines for sleep  certain medicines for seizures like carbamazepine, valproic acid, phenytoin  certain medicines that treat or prevent blood clots like warfarin, enoxaparin, dalteparin  cimetidine  digoxin  diuretics  fentanyl  isoniazid  lithium  NSAIDs, medicines for pain and inflammation, like ibuprofen or naproxen  other medicines that prolong the QT interval (cause an abnormal heart rhythm) like dofetilide  rasagiline  safinamide  supplements like St. John's wort, kava kava, valerian  tolbutamide  tramadol  tryptophan This list may not describe all possible interactions. Give your health care provider a list of all the medicines, herbs, non-prescription drugs, or dietary supplements you use. Also tell them if you smoke, drink alcohol, or use illegal drugs. Some items may interact with your medicine. What should I watch for while using this medicine? Tell your doctor if your symptoms do not get better or if they get worse. Visit your doctor or health care professional for regular checks on your progress. Because it may take several weeks  to see the full effects of this medicine, it is important to continue your treatment as prescribed by your doctor. Patients and their families should watch out for new or worsening thoughts of suicide or depression. Also watch out for sudden changes in feelings such as feeling anxious, agitated, panicky, irritable, hostile, aggressive, impulsive, severely restless, overly excited and hyperactive, or not being able to sleep. If this happens, especially at the beginning of treatment or after a change in dose, call your health care professional. Bonita Quin may get drowsy or dizzy. Do not drive, use machinery, or do anything that needs mental alertness until you know how this medicine affects you. Do not stand or sit up quickly, especially if you are an older patient. This reduces the risk of dizzy or fainting spells. Alcohol may interfere with the effect of this medicine. Avoid alcoholic drinks. Your mouth may get dry. Chewing sugarless gum or sucking hard candy, and drinking plenty of water may help. Contact your doctor if the problem does not go away or is severe. What side effects may I notice from receiving this medicine? Side effects that you should report to your doctor or health care professional as soon as possible:  allergic reactions like skin rash, itching or hives, swelling of the face,  lips, or tongue  anxious  black, tarry stools  changes in vision  confusion  elevated mood, decreased need for sleep, racing thoughts, impulsive behavior  eye pain  fast, irregular heartbeat  feeling faint or lightheaded, falls  feeling agitated, angry, or irritable  hallucination, loss of contact with reality  loss of balance or coordination  loss of memory  painful or prolonged erections  restlessness, pacing, inability to keep still  seizures  stiff muscles  suicidal thoughts or other mood changes  trouble sleeping  unusual bleeding or bruising  unusually weak or  tired  vomiting Side effects that usually do not require medical attention (report to your doctor or health care professional if they continue or are bothersome):  change in appetite or weight  change in sex drive or performance  diarrhea  increased sweating  indigestion, nausea  tremors This list may not describe all possible side effects. Call your doctor for medical advice about side effects. You may report side effects to FDA at 1-800-FDA-1088. Where should I keep my medicine? Keep out of the reach of children. Store at room temperature between 15 and 30 degrees C (59 and 86 degrees F). Throw away any unused medicine after the expiration date. NOTE: This sheet is a summary. It may not cover all possible information. If you have questions about this medicine, talk to your doctor, pharmacist, or health care provider.  2020 Elsevier/Gold Standard (2018-07-25 10:09:27)

## 2020-05-14 NOTE — Progress Notes (Signed)
Established patient visit   Patient: Dan Jones   DOB: [birth year]   39 y.o. Male  MRN: [id number] Visit Date: 05/14/2020  Today's healthcare provider: Margaretann Loveless, PA-C   Chief Complaint  Patient presents with  . Depression  . Anxiety   Subjective    HPI  Patient here to follow on Depression and Anxiety. Reports that this week it just started acting up feels is from lack of sleep. Reports that he wakes up at least 2-3 times to go to the bathroom. Reports that he was feeling better when he started taking the thyroid medicine but lately is just like he fell off a cliff.  Has tried and failed wellbutrin, venlafaxine, seroquel, buspar, trazodone, zolpidem.   Depression screen [facility] 2/9 05/14/2020 04/11/2020  Decreased Interest 1 1  Down, Depressed, Hopeless 1 1  PHQ - 2 Score 2 2  Altered sleeping 3 3  Tired, decreased energy 3 2  Change in appetite 3 3  Feeling bad or failure about yourself  0 1  Trouble concentrating 0 1  Moving slowly or fidgety/restless 0 1  Suicidal thoughts 0 0  PHQ-9 Score 11 13  Difficult doing work/chores Somewhat difficult Somewhat difficult   GAD 7 : Generalized Anxiety Score 05/14/2020  Nervous, Anxious, on Edge 2  Control/stop worrying 0  Worry too much - different things 0  Trouble relaxing 3  Restless 3  Easily annoyed or irritable 3  Afraid - awful might happen 0  Total GAD 7 Score 11  Anxiety Difficulty Somewhat difficult    Patient Active Problem List   Diagnosis Date Noted  . Generalized anxiety disorder with panic attacks 12/07/2016  . History of allergy 03/11/2015  . Tobacco user 03/11/2015  . Drug abuse (HCC) 03/11/2015  . Insomnia 03/11/2015  . Depression 03/11/2015  . Anxiety disorder 03/11/2015   History reviewed. No pertinent past medical history.     Medications: Outpatient Medications Prior to Visit  Medication Sig  . levothyroxine (SYNTHROID) 25 MCG tablet Take 1 tablet (25 mcg total) by mouth  daily before breakfast.  . mometasone (ELOCON) 0.1 % cream Apply 1 application topically daily.  Marland Kitchen VALERIAN ROOT PO Take by mouth.  . Melatonin 3 MG TABS Take 1 tablet by mouth at bedtime as needed. (Patient not taking: Reported on 04/11/2020)  . traZODone (DESYREL) 50 MG tablet Take 0.5-1 tablets (25-50 mg total) by mouth at bedtime as needed for sleep. (Patient not taking: Reported on 04/11/2020)   No facility-administered medications prior to visit.    Review of Systems  Constitutional: Negative.   Respiratory: Negative.   Cardiovascular: Negative.   Neurological: Negative.   Psychiatric/Behavioral: Positive for dysphoric mood. The patient is nervous/anxious.     Last CBC Lab Results  Component Value Date   WBC 9.0 04/15/2020   HGB 14.4 04/15/2020   HCT 42.4 04/15/2020   MCV 95 04/15/2020   MCH 32.1 04/15/2020   RDW 12.3 04/15/2020   PLT 177 04/15/2020   Last metabolic panel Lab Results  Component Value Date   GLUCOSE 96 04/15/2020   NA 139 04/15/2020   K 4.2 04/15/2020   CL 102 04/15/2020   CO2 23 04/15/2020   BUN 9 04/15/2020   CREATININE 0.96 04/15/2020   GFRNONAA 100 04/15/2020   GFRAA 115 04/15/2020   CALCIUM 9.4 04/15/2020   PROT 6.9 04/15/2020   ALBUMIN 4.7 04/15/2020   LABGLOB 2.2 04/15/2020   AGRATIO 2.1 04/15/2020   BILITOT  0.5 04/15/2020   ALKPHOS 67 04/15/2020   AST 14 04/15/2020   ALT 12 04/15/2020      Objective    BP 109/81 (BP Location: Left Arm, Patient Position: Sitting, Cuff Size: Large)   Pulse 78   Temp 98.9 F (37.2 C) (Oral)   Resp 16   Wt 202 lb 9.6 oz (91.9 kg)   SpO2 98%   BMI 30.81 kg/m  BP Readings from Last 3 Encounters:  05/14/20 109/81  04/11/20 120/80  12/28/16 114/82   Wt Readings from Last 3 Encounters:  05/14/20 202 lb 9.6 oz (91.9 kg)  04/11/20 204 lb 9.6 oz (92.8 kg)  12/28/16 184 lb (83.5 kg)      Physical Exam Vitals reviewed.  Constitutional:      General: He is not in acute distress.    Appearance:  Normal appearance. He is well-developed.  HENT:     Head: Normocephalic and atraumatic.  Eyes:     Conjunctiva/sclera: Conjunctivae normal.  Pulmonary:     Effort: Pulmonary effort is normal. No respiratory distress.  Musculoskeletal:     Cervical back: Normal range of motion and neck supple.  Neurological:     Mental Status: He is alert.  Psychiatric:        Attention and Perception: Attention and perception normal.        Mood and Affect: Affect normal. Mood is anxious.        Speech: Speech normal.        Behavior: Behavior normal. Behavior is cooperative.        Thought Content: Thought content normal.        Cognition and Memory: Cognition and memory normal.        Judgment: Judgment normal.     No results found for any visits on 05/14/20.  Assessment & Plan     1. Current moderate episode of major depressive disorder without prior episode (HCC) Continue Sertraline 50mg  daily. Hydroxyzine 25mg  TID prn for acute anxiety. F/U in 4-6 weeks.  - hydrOXYzine (ATARAX/VISTARIL) 25 MG tablet; Take 1 tablet (25 mg total) by mouth 3 (three) times daily as needed for anxiety.  Dispense: 30 tablet; Refill: 1  2. Generalized anxiety disorder with panic attacks See above medical treatment plan. - hydrOXYzine (ATARAX/VISTARIL) 25 MG tablet; Take 1 tablet (25 mg total) by mouth 3 (three) times daily as needed for anxiety.  Dispense: 30 tablet; Refill: 1  3. Primary insomnia Will start with sertraline and Hydroxyzine as below. May consider sleep aids if needed. Patient does snore and reports a lot of sinus/nasal congestion. Will refer to ENT for the sinus congestion to see if upper respiratory blockage/issues contributing to his sleeping issues prior to ordering a home sleep study.  4. Sinus congestion See above medical treatment plan. - Ambulatory referral to ENT  5. Rash Failed Mometasone. Will change to betamethasone as below. F/U if not improving.  - augmented betamethasone  dipropionate (DIPROLENE-AF) 0.05 % cream; Apply topically 2 (two) times daily.  Dispense: 30 g; Refill: 0   No follow-ups on file.      , PA-C, have reviewed all documentation for this visit. The documentation on 05/20/20 for the exam, diagnosis, procedures, and orders are all accurate and complete.   Delmer Islam  [facility] [phone] (phone) [phone] (fax)  [facility]

## 2020-05-19 ENCOUNTER — Encounter: Payer: Self-pay | Admitting: Physician Assistant

## 2020-05-19 DIAGNOSIS — F321 Major depressive disorder, single episode, moderate: Secondary | ICD-10-CM

## 2020-05-19 DIAGNOSIS — F411 Generalized anxiety disorder: Secondary | ICD-10-CM

## 2020-05-19 DIAGNOSIS — F410 Panic disorder [episodic paroxysmal anxiety] without agoraphobia: Secondary | ICD-10-CM

## 2020-05-19 MED ORDER — SERTRALINE HCL 50 MG PO TABS: 50.0000 mg | ORAL_TABLET | Freq: Every day | ORAL | 3 refills | Status: DC

## 2020-05-25 ENCOUNTER — Encounter: Payer: Self-pay | Admitting: Physician Assistant

## 2020-05-25 DIAGNOSIS — F321 Major depressive disorder, single episode, moderate: Secondary | ICD-10-CM

## 2020-05-25 DIAGNOSIS — F411 Generalized anxiety disorder: Secondary | ICD-10-CM

## 2020-05-25 DIAGNOSIS — F410 Panic disorder [episodic paroxysmal anxiety] without agoraphobia: Secondary | ICD-10-CM

## 2020-05-26 ENCOUNTER — Other Ambulatory Visit: Payer: Self-pay | Admitting: Physician Assistant

## 2020-05-26 DIAGNOSIS — F411 Generalized anxiety disorder: Secondary | ICD-10-CM

## 2020-05-26 DIAGNOSIS — F321 Major depressive disorder, single episode, moderate: Secondary | ICD-10-CM

## 2020-05-26 DIAGNOSIS — F410 Panic disorder [episodic paroxysmal anxiety] without agoraphobia: Secondary | ICD-10-CM

## 2020-05-26 MED ORDER — SERTRALINE HCL 50 MG PO TABS: ORAL_TABLET | ORAL | 3 refills | Status: DC

## 2020-06-16 ENCOUNTER — Encounter: Payer: Self-pay | Admitting: Physician Assistant

## 2020-06-16 ENCOUNTER — Telehealth (INDEPENDENT_AMBULATORY_CARE_PROVIDER_SITE_OTHER): Payer: Commercial Managed Care - PPO | Admitting: Physician Assistant

## 2020-06-16 DIAGNOSIS — F410 Panic disorder [episodic paroxysmal anxiety] without agoraphobia: Secondary | ICD-10-CM

## 2020-06-16 DIAGNOSIS — F411 Generalized anxiety disorder: Secondary | ICD-10-CM

## 2020-06-16 DIAGNOSIS — F324 Major depressive disorder, single episode, in partial remission: Secondary | ICD-10-CM

## 2020-06-16 DIAGNOSIS — Z716 Tobacco abuse counseling: Secondary | ICD-10-CM

## 2020-06-16 DIAGNOSIS — E034 Atrophy of thyroid (acquired): Secondary | ICD-10-CM | POA: Diagnosis not present

## 2020-06-16 MED ORDER — SERTRALINE HCL 50 MG PO TABS
50.0000 mg | ORAL_TABLET | Freq: Every day | ORAL | 1 refills | Status: AC
Start: 2020-06-16 — End: ?

## 2020-06-16 NOTE — Progress Notes (Signed)
MyChart Video Visit    Virtual Visit via Video Note   This visit type was conducted due to national recommendations for restrictions regarding the COVID-19 Pandemic (e.g. social distancing) in an effort to limit this patient's exposure and mitigate transmission in our community. This patient is at least at moderate risk for complications without adequate follow up. This format is felt to be most appropriate for this patient at this time. Physical exam was limited by quality of the video and audio technology used for the visit.   Patient location: Stopped in car  Provider location: [facility]  I discussed the limitations of evaluation and management by telemedicine and the availability of in person appointments. The patient expressed understanding and agreed to proceed.  Patient: Dan Jones   DOB: 1981/07/27   39 y.o. Male  MRN: [id number] Visit Date: 06/16/2020  Today's healthcare provider: Margaretann Loveless, PA-C   Chief Complaint  Patient presents with  . Follow-up   Subjective    HPI  Depression: patient following up. He was to continue Sertraline 50mg  daily and Hydroxyzine TID prn for acute anxiety. Reports he is doing much better. Sleeping better. Did have to take more hydroxyzine initially, but reports over the last week he has not taken any. He did have slight side effects initially with the sertraline and had to taper slowly. He is now tolerating well.   He is also following up new hypothyroid. He is tolerating the levothyroxine well. Due for labs.  Patient Active Problem List   Diagnosis Date Noted  . Generalized anxiety disorder with panic attacks 12/07/2016  . History of allergy 03/11/2015  . Tobacco user 03/11/2015  . Drug abuse (HCC) 03/11/2015  . Insomnia 03/11/2015  . Depression 03/11/2015  . Anxiety disorder 03/11/2015   History reviewed. No pertinent past medical history.    Medications: Outpatient Medications Prior to Visit  Medication Sig  .  augmented betamethasone dipropionate (DIPROLENE-AF) 0.05 % cream Apply topically 2 (two) times daily.  . hydrOXYzine (ATARAX/VISTARIL) 25 MG tablet Take 1 tablet (25 mg total) by mouth 3 (three) times daily as needed for anxiety.  03/13/2015 VALERIAN ROOT PO Take by mouth.  . [DISCONTINUED] levothyroxine (SYNTHROID) 25 MCG tablet Take 1 tablet (25 mcg total) by mouth daily before breakfast.  . [DISCONTINUED] sertraline (ZOLOFT) 50 MG tablet Take 0.5 tab (25mg ) PO q hs x 1 week, then increase to 1 tab PO q hs   No facility-administered medications prior to visit.    Review of Systems  Constitutional: Negative.   Respiratory: Negative.   Cardiovascular: Negative.   Psychiatric/Behavioral: Negative.     Last CBC Lab Results  Component Value Date   WBC 9.0 04/15/2020   HGB 14.4 04/15/2020   HCT 42.4 04/15/2020   MCV 95 04/15/2020   MCH 32.1 04/15/2020   RDW 12.3 04/15/2020   PLT 177 04/15/2020   Last metabolic panel Lab Results  Component Value Date   GLUCOSE 96 04/15/2020   NA 139 04/15/2020   K 4.2 04/15/2020   CL 102 04/15/2020   CO2 23 04/15/2020   BUN 9 04/15/2020   CREATININE 0.96 04/15/2020   GFRNONAA 100 04/15/2020   GFRAA 115 04/15/2020   CALCIUM 9.4 04/15/2020   PROT 6.9 04/15/2020   ALBUMIN 4.7 04/15/2020   LABGLOB 2.2 04/15/2020   AGRATIO 2.1 04/15/2020   BILITOT 0.5 04/15/2020   ALKPHOS 67 04/15/2020   AST 14 04/15/2020   ALT 12 04/15/2020  Objective    There were no vitals taken for this visit. BP Readings from Last 3 Encounters:  05/14/20 109/81  04/11/20 120/80  12/28/16 114/82   Wt Readings from Last 3 Encounters:  05/14/20 202 lb 9.6 oz (91.9 kg)  04/11/20 204 lb 9.6 oz (92.8 kg)  12/28/16 184 lb (83.5 kg)      Physical Exam Vitals reviewed.  Constitutional:      General: He is not in acute distress.    Appearance: He is well-developed. He is not ill-appearing.  HENT:     Head: Normocephalic and atraumatic.  Eyes:      Conjunctiva/sclera: Conjunctivae normal.  Pulmonary:     Effort: Pulmonary effort is normal. No respiratory distress.  Musculoskeletal:     Cervical back: Normal range of motion and neck supple.  Neurological:     Mental Status: He is alert.  Psychiatric:        Mood and Affect: Mood normal.        Behavior: Behavior normal.        Thought Content: Thought content normal.        Judgment: Judgment normal.       Assessment & Plan     1. Generalized anxiety disorder with panic attacks Improved. Diagnosis pulled for medication refill. Continue current medical treatment plan. - sertraline (ZOLOFT) 50 MG tablet; Take 1 tablet (50 mg total) by mouth at bedtime.  Dispense: 90 tablet; Refill: 1  2. Major depressive disorder with single episode, in partial remission (HCC) See above medical treatment plan. - sertraline (ZOLOFT) 50 MG tablet; Take 1 tablet (50 mg total) by mouth at bedtime.  Dispense: 90 tablet; Refill: 1  3. Hypothyroidism due to acquired atrophy of thyroid Will check labs as below and f/u pending results.  Labs showed still hypothyroid. Increase levothyroxine to as below.  - T4 AND TSH - levothyroxine (SYNTHROID) 50 MCG tablet; Take 1 tablet (50 mcg total) by mouth daily.  Dispense: 90 tablet; Refill: 0  4. Encounter for smoking cessation counseling Discussed options for smoking cessation. Has tried wellbutrin unsuccessfully in the past. Desires to most likely try to quit on his own.    Return in about 6 months (around 12/14/2020).     I discussed the assessment and treatment plan with the patient. The patient was provided an opportunity to ask questions and all were answered. The patient agreed with the plan and demonstrated an understanding of the instructions.   The patient was advised to call back or seek an in-person evaluation if the symptoms worsen or if the condition fails to improve as anticipated.  I provided 13 minutes of face-to-face time during  this encounter via MyChart Video enabled encounter.  Delmer Islam, PA-C, have reviewed all documentation for this visit. The documentation on 06/17/20 for the exam, diagnosis, procedures, and orders are all accurate and complete.  Reine Just [facility] [phone] (phone) [phone] (fax)  [facility]

## 2020-06-16 NOTE — Patient Instructions (Signed)

## 2020-06-17 LAB — T4 AND TSH
T4, Total: 6.8 ug/dL (ref 4.5–12.0)
TSH: 7.48 u[IU]/mL — ABNORMAL HIGH (ref 0.450–4.500)

## 2020-06-17 MED ORDER — LEVOTHYROXINE SODIUM 50 MCG PO TABS: 50.0000 ug | ORAL_TABLET | Freq: Every day | ORAL | 0 refills | Status: DC

## 2020-06-18 ENCOUNTER — Telehealth: Payer: Self-pay

## 2020-06-18 DIAGNOSIS — E034 Atrophy of thyroid (acquired): Secondary | ICD-10-CM

## 2020-06-18 NOTE — Telephone Encounter (Signed)
Patient advised.

## 2020-06-18 NOTE — Telephone Encounter (Signed)
-----   Message from Margaretann Loveless, [place] sent at 06/17/2020 12:54 PM EDT ----- Thyroid is still underactive. Will increase levothyroxine to and recheck in 6-8 weeks again. Will need lab only.

## 2020-09-02 ENCOUNTER — Telehealth (INDEPENDENT_AMBULATORY_CARE_PROVIDER_SITE_OTHER): Payer: Commercial Managed Care - PPO | Admitting: Family Medicine

## 2020-09-02 ENCOUNTER — Other Ambulatory Visit: Payer: Self-pay

## 2020-09-02 ENCOUNTER — Encounter: Payer: Self-pay | Admitting: Family Medicine

## 2020-09-02 ENCOUNTER — Encounter: Payer: Self-pay | Admitting: Physician Assistant

## 2020-09-02 DIAGNOSIS — R509 Fever, unspecified: Secondary | ICD-10-CM | POA: Diagnosis not present

## 2020-09-02 DIAGNOSIS — Z20822 Contact with and (suspected) exposure to covid-19: Secondary | ICD-10-CM

## 2020-09-02 NOTE — Progress Notes (Signed)
MyChart Video Visit    Virtual Visit via Video Note   This visit type was conducted due to national recommendations for restrictions regarding the COVID-19 Pandemic (e.g. social distancing) in an effort to limit this patient's exposure and mitigate transmission in our community. This patient is at least at moderate risk for complications without adequate follow up. This format is felt to be most appropriate for this patient at this time. Physical exam was limited by quality of the video and audio technology used for the visit.   Patient location: Home Provider location: Home  I discussed the limitations of evaluation and management by telemedicine and the availability of in person appointments. The patient expressed understanding and agreed to proceed.  Patient: Dan Jones   DOB: October 20, 1980   39 y.o. Male  MRN: [id number] Visit Date: 09/02/2020  Today's healthcare provider: Dortha Kern, PA-C   No chief complaint on file.  Subjective    HPI  The patient is a 40 year old male who presents via video visit for evaluation of symptoms consistent with Covid after exposure at work.  Patient states that 3 people from work have tested positive for Covid.  He has been exposed to them as recently as 08/30/20.  He began having symptoms on 09/01/20.   He has had temperature as high as 101.2 he is treating with Ibuprofen.  He has had no loss of taste or smell.  He has not yet been tested for Covid.  No past medical history on file. Past Surgical History:  Procedure Laterality Date  . APPENDECTOMY  2006   Social History   Tobacco Use  . Smoking status: Current Every Day Smoker    Packs/day: 2.00    Types: Cigarettes  . Smokeless tobacco: Never Used  Vaping Use  . Vaping Use: Never used  Substance Use Topics  . Alcohol use: Not Currently    Alcohol/week: 0.0 standard drinks    Comment: OCCASIONALLY  . Drug use: Yes    Comment: USES MARIJUANA MORE THAN ONCE DAILY- 10 YEARS,  HIGH TOLERANCE Couple of times a week   Family History  Problem Relation Age of Onset  . Depression Mother   . Cancer Mother        cervical  . Hypertension Mother   . Anxiety disorder Mother   . Depression Father   . Alcohol abuse Brother   . Hypertension Brother   . Anxiety disorder Brother   . Alcohol abuse Maternal Uncle   . Drug abuse Maternal Uncle   . Alcohol abuse Paternal Uncle    No Known Allergies  Medications: Outpatient Medications Prior to Visit  Medication Sig  . augmented betamethasone dipropionate (DIPROLENE-AF) 0.05 % cream Apply topically 2 (two) times daily.  . hydrOXYzine (ATARAX/VISTARIL) 25 MG tablet Take 1 tablet (25 mg total) by mouth 3 (three) times daily as needed for anxiety.  Marland Kitchen levothyroxine (SYNTHROID) 50 MCG tablet Take 1 tablet (50 mcg total) by mouth daily.  . sertraline (ZOLOFT) 50 MG tablet Take 1 tablet (50 mg total) by mouth at bedtime.  Marland Kitchen VALERIAN ROOT PO Take by mouth.   No facility-administered medications prior to visit.    Review of Systems  Constitutional: Positive for chills, diaphoresis, fatigue and fever.  HENT: Positive for congestion (head), postnasal drip, rhinorrhea, sinus pressure and sore throat. Negative for ear discharge, ear pain, sinus pain, sneezing and tinnitus.   Respiratory: Positive for cough (productive of yellow/green) and shortness of breath. Negative for  wheezing.   Gastrointestinal: Positive for nausea. Negative for abdominal pain and diarrhea.  Musculoskeletal: Positive for myalgias.  Neurological: Positive for light-headedness and headaches. Negative for dizziness.    Objective    There were no vitals taken for this visit.   Physical Exam: Unable to connect the video screen with this patient. Clear audio connection and patient does not seem to be in any significant distress at the present.    Assessment & Plan    1. Fever and chills Developed headache with chills and temperature up to 101 on 08-31-20.  Also, had some diarrhea and vomited once last night. GI upset better today but having some slight cough, sore throat and fatigue. Feeling better with use of Ibuprofen, Vitamin C and B12. Increase fluids and recheck prn. - Coronavirus (COVID-19) with Influenza A and Influenza B  2. Exposure to COVID-19 virus Has had close exposure to 3 co-worker on 08-30-20 and all were reported as positive for COVID-19. Will test him for COVID and influenza with his above symptoms. Must drink extra fluids and maintain COVID quarantine with pandemic restrictions.  - Coronavirus (COVID-19) with Influenza A and Influenza B   No follow-ups on file.     I discussed the assessment and treatment plan with the patient. The patient was provided an opportunity to ask questions and all were answered. The patient agreed with the plan and demonstrated an understanding of the instructions.   The patient was advised to call back or seek an in-person evaluation if the symptoms worsen or if the condition fails to improve as anticipated.  I provided 24 minutes of non-face-to-face time during this encounter.  I, Dennis Chrismon, PA-C, have reviewed all documentation for this visit. The documentation on 09/02/20 for the exam, diagnosis, procedures, and orders are all accurate and complete.   Dortha Kern, PA-C [employer] [phone] (phone) [phone] (fax)  [facility]

## 2020-09-05 LAB — COVID-19, FLU A+B NAA
Influenza A, NAA: NOT DETECTED
Influenza B, NAA: NOT DETECTED
SARS-CoV-2, NAA: DETECTED — AB

## 2020-10-18 ENCOUNTER — Other Ambulatory Visit: Payer: Self-pay | Admitting: Physician Assistant

## 2020-10-18 DIAGNOSIS — E034 Atrophy of thyroid (acquired): Secondary | ICD-10-CM

## 2020-10-20 MED ORDER — LEVOTHYROXINE SODIUM 50 MCG PO TABS: 50.0000 ug | ORAL_TABLET | Freq: Every day | ORAL | 0 refills | Status: AC
